# Patient Record
Sex: Female | Born: 1970 | Race: Black or African American | Hispanic: No | Marital: Single | Smoking: Current some day smoker
Health system: Southern US, Community
[De-identification: ages and names within clinical notes are randomized; demographics above are authoritative.]

## PROBLEM LIST (undated history)

## (undated) DIAGNOSIS — Z21 Asymptomatic human immunodeficiency virus [HIV] infection status: Secondary | ICD-10-CM

## (undated) DIAGNOSIS — B20 Human immunodeficiency virus [HIV] disease: Secondary | ICD-10-CM

## (undated) DIAGNOSIS — E119 Type 2 diabetes mellitus without complications: Secondary | ICD-10-CM

## (undated) HISTORY — PX: APPENDECTOMY: SHX54

---

## 1997-11-29 ENCOUNTER — Inpatient Hospital Stay (HOSPITAL_COMMUNITY): Admission: AD | Admit: 1997-11-29 | Discharge: 1997-11-29 | Payer: Self-pay | Admitting: *Deleted

## 1997-12-20 ENCOUNTER — Emergency Department (HOSPITAL_COMMUNITY): Admission: EM | Admit: 1997-12-20 | Discharge: 1997-12-20 | Payer: Self-pay | Admitting: Emergency Medicine

## 1997-12-22 ENCOUNTER — Emergency Department (HOSPITAL_COMMUNITY): Admission: EM | Admit: 1997-12-22 | Discharge: 1997-12-22 | Payer: Self-pay | Admitting: Emergency Medicine

## 1998-01-02 ENCOUNTER — Emergency Department (HOSPITAL_COMMUNITY): Admission: EM | Admit: 1998-01-02 | Discharge: 1998-01-02 | Payer: Self-pay | Admitting: Emergency Medicine

## 1998-03-15 ENCOUNTER — Emergency Department (HOSPITAL_COMMUNITY): Admission: EM | Admit: 1998-03-15 | Discharge: 1998-03-15 | Payer: Self-pay | Admitting: Emergency Medicine

## 1998-04-19 ENCOUNTER — Emergency Department (HOSPITAL_COMMUNITY): Admission: EM | Admit: 1998-04-19 | Discharge: 1998-04-19 | Payer: Self-pay

## 1999-07-20 ENCOUNTER — Inpatient Hospital Stay (HOSPITAL_COMMUNITY): Admission: AD | Admit: 1999-07-20 | Discharge: 1999-07-20 | Payer: Self-pay | Admitting: *Deleted

## 1999-08-01 ENCOUNTER — Ambulatory Visit (HOSPITAL_COMMUNITY): Admission: RE | Admit: 1999-08-01 | Discharge: 1999-08-01 | Payer: Self-pay | Admitting: *Deleted

## 1999-08-01 ENCOUNTER — Encounter: Payer: Self-pay | Admitting: *Deleted

## 1999-08-15 ENCOUNTER — Inpatient Hospital Stay (HOSPITAL_COMMUNITY): Admission: AD | Admit: 1999-08-15 | Discharge: 1999-08-15 | Payer: Self-pay | Admitting: *Deleted

## 1999-12-24 ENCOUNTER — Inpatient Hospital Stay (HOSPITAL_COMMUNITY): Admission: AD | Admit: 1999-12-24 | Discharge: 1999-12-27 | Payer: Self-pay | Admitting: *Deleted

## 1999-12-24 ENCOUNTER — Encounter: Payer: Self-pay | Admitting: *Deleted

## 2000-04-03 ENCOUNTER — Emergency Department (HOSPITAL_COMMUNITY): Admission: EM | Admit: 2000-04-03 | Discharge: 2000-04-03 | Payer: Self-pay | Admitting: Emergency Medicine

## 2000-09-19 ENCOUNTER — Emergency Department (HOSPITAL_COMMUNITY): Admission: EM | Admit: 2000-09-19 | Discharge: 2000-09-19 | Payer: Self-pay | Admitting: Emergency Medicine

## 2000-10-08 ENCOUNTER — Emergency Department (HOSPITAL_COMMUNITY): Admission: EM | Admit: 2000-10-08 | Discharge: 2000-10-08 | Payer: Self-pay | Admitting: Emergency Medicine

## 2001-10-08 ENCOUNTER — Emergency Department (HOSPITAL_COMMUNITY): Admission: EM | Admit: 2001-10-08 | Discharge: 2001-10-08 | Payer: Self-pay | Admitting: Emergency Medicine

## 2002-02-02 ENCOUNTER — Encounter: Payer: Self-pay | Admitting: Emergency Medicine

## 2002-02-02 ENCOUNTER — Emergency Department (HOSPITAL_COMMUNITY): Admission: EM | Admit: 2002-02-02 | Discharge: 2002-02-02 | Payer: Self-pay | Admitting: Emergency Medicine

## 2002-12-23 ENCOUNTER — Emergency Department (HOSPITAL_COMMUNITY): Admission: EM | Admit: 2002-12-23 | Discharge: 2002-12-23 | Payer: Self-pay | Admitting: Emergency Medicine

## 2003-02-02 ENCOUNTER — Emergency Department (HOSPITAL_COMMUNITY): Admission: EM | Admit: 2003-02-02 | Discharge: 2003-02-02 | Payer: Self-pay | Admitting: Emergency Medicine

## 2003-04-08 ENCOUNTER — Emergency Department (HOSPITAL_COMMUNITY): Admission: EM | Admit: 2003-04-08 | Discharge: 2003-04-08 | Payer: Self-pay | Admitting: Emergency Medicine

## 2003-08-08 ENCOUNTER — Emergency Department (HOSPITAL_COMMUNITY): Admission: EM | Admit: 2003-08-08 | Discharge: 2003-08-08 | Payer: Self-pay | Admitting: Emergency Medicine

## 2003-08-12 ENCOUNTER — Emergency Department (HOSPITAL_COMMUNITY): Admission: EM | Admit: 2003-08-12 | Discharge: 2003-08-12 | Payer: Self-pay | Admitting: Emergency Medicine

## 2004-02-28 ENCOUNTER — Emergency Department (HOSPITAL_COMMUNITY): Admission: EM | Admit: 2004-02-28 | Discharge: 2004-02-28 | Payer: Self-pay | Admitting: Emergency Medicine

## 2004-05-21 ENCOUNTER — Emergency Department (HOSPITAL_COMMUNITY): Admission: EM | Admit: 2004-05-21 | Discharge: 2004-05-21 | Payer: Self-pay | Admitting: Emergency Medicine

## 2004-09-10 ENCOUNTER — Inpatient Hospital Stay (HOSPITAL_COMMUNITY): Admission: AD | Admit: 2004-09-10 | Discharge: 2004-09-10 | Payer: Self-pay | Admitting: Obstetrics & Gynecology

## 2004-09-12 ENCOUNTER — Inpatient Hospital Stay (HOSPITAL_COMMUNITY): Admission: RE | Admit: 2004-09-12 | Discharge: 2004-09-12 | Payer: Self-pay | Admitting: *Deleted

## 2005-02-26 ENCOUNTER — Emergency Department (HOSPITAL_COMMUNITY): Admission: EM | Admit: 2005-02-26 | Discharge: 2005-02-26 | Payer: Self-pay | Admitting: Emergency Medicine

## 2005-02-28 ENCOUNTER — Emergency Department (HOSPITAL_COMMUNITY): Admission: EM | Admit: 2005-02-28 | Discharge: 2005-02-28 | Payer: Self-pay | Admitting: Emergency Medicine

## 2005-04-29 ENCOUNTER — Emergency Department (HOSPITAL_COMMUNITY): Admission: EM | Admit: 2005-04-29 | Discharge: 2005-04-29 | Payer: Self-pay | Admitting: *Deleted

## 2005-08-05 ENCOUNTER — Inpatient Hospital Stay (HOSPITAL_COMMUNITY): Admission: AD | Admit: 2005-08-05 | Discharge: 2005-08-05 | Payer: Self-pay | Admitting: Obstetrics and Gynecology

## 2005-09-11 ENCOUNTER — Emergency Department (HOSPITAL_COMMUNITY): Admission: EM | Admit: 2005-09-11 | Discharge: 2005-09-11 | Payer: Self-pay | Admitting: Emergency Medicine

## 2005-10-01 ENCOUNTER — Emergency Department (HOSPITAL_COMMUNITY): Admission: EM | Admit: 2005-10-01 | Discharge: 2005-10-01 | Payer: Self-pay | Admitting: Emergency Medicine

## 2005-10-02 ENCOUNTER — Emergency Department (HOSPITAL_COMMUNITY): Admission: EM | Admit: 2005-10-02 | Discharge: 2005-10-02 | Payer: Self-pay | Admitting: Emergency Medicine

## 2006-04-15 ENCOUNTER — Emergency Department (HOSPITAL_COMMUNITY): Admission: EM | Admit: 2006-04-15 | Discharge: 2006-04-15 | Payer: Self-pay | Admitting: Emergency Medicine

## 2007-06-03 ENCOUNTER — Emergency Department (HOSPITAL_COMMUNITY): Admission: EM | Admit: 2007-06-03 | Discharge: 2007-06-03 | Payer: Self-pay | Admitting: Emergency Medicine

## 2007-08-13 ENCOUNTER — Emergency Department (HOSPITAL_COMMUNITY): Admission: EM | Admit: 2007-08-13 | Discharge: 2007-08-13 | Payer: Self-pay | Admitting: Family Medicine

## 2007-09-14 ENCOUNTER — Emergency Department (HOSPITAL_COMMUNITY): Admission: EM | Admit: 2007-09-14 | Discharge: 2007-09-14 | Payer: Self-pay | Admitting: Emergency Medicine

## 2007-11-08 ENCOUNTER — Inpatient Hospital Stay (HOSPITAL_COMMUNITY): Admission: AD | Admit: 2007-11-08 | Discharge: 2007-11-08 | Payer: Self-pay | Admitting: Family Medicine

## 2007-11-18 ENCOUNTER — Emergency Department (HOSPITAL_COMMUNITY): Admission: EM | Admit: 2007-11-18 | Discharge: 2007-11-18 | Payer: Self-pay | Admitting: Emergency Medicine

## 2007-11-21 ENCOUNTER — Emergency Department (HOSPITAL_COMMUNITY): Admission: EM | Admit: 2007-11-21 | Discharge: 2007-11-21 | Payer: Self-pay | Admitting: Emergency Medicine

## 2008-08-02 ENCOUNTER — Inpatient Hospital Stay (HOSPITAL_COMMUNITY): Admission: AD | Admit: 2008-08-02 | Discharge: 2008-08-02 | Payer: Self-pay | Admitting: Family Medicine

## 2008-08-15 ENCOUNTER — Emergency Department (HOSPITAL_COMMUNITY): Admission: EM | Admit: 2008-08-15 | Discharge: 2008-08-15 | Payer: Self-pay | Admitting: Licensed Clinical Social Worker

## 2008-10-11 ENCOUNTER — Inpatient Hospital Stay (HOSPITAL_COMMUNITY): Admission: AD | Admit: 2008-10-11 | Discharge: 2008-10-11 | Payer: Self-pay | Admitting: Obstetrics

## 2008-10-24 ENCOUNTER — Inpatient Hospital Stay (HOSPITAL_COMMUNITY): Admission: AD | Admit: 2008-10-24 | Discharge: 2008-10-24 | Payer: Self-pay | Admitting: Obstetrics

## 2008-10-28 ENCOUNTER — Ambulatory Visit (HOSPITAL_COMMUNITY): Admission: RE | Admit: 2008-10-28 | Discharge: 2008-10-28 | Payer: Self-pay | Admitting: Obstetrics

## 2008-11-02 ENCOUNTER — Emergency Department (HOSPITAL_COMMUNITY): Admission: EM | Admit: 2008-11-02 | Discharge: 2008-11-02 | Payer: Self-pay | Admitting: Family Medicine

## 2010-05-02 ENCOUNTER — Ambulatory Visit (HOSPITAL_COMMUNITY): Admission: RE | Admit: 2010-05-02 | Discharge: 2010-05-02 | Payer: Self-pay | Admitting: Obstetrics

## 2010-08-29 LAB — CBC
MCH: 25.6 pg — ABNORMAL LOW (ref 26.0–34.0)
MCHC: 32.4 g/dL (ref 30.0–36.0)
MCV: 79 fL (ref 78.0–100.0)
Platelets: 275 10*3/uL (ref 150–400)
RDW: 16.1 % — ABNORMAL HIGH (ref 11.5–15.5)
WBC: 8.3 10*3/uL (ref 4.0–10.5)

## 2010-09-26 LAB — GC/CHLAMYDIA PROBE AMP, GENITAL
Chlamydia, DNA Probe: NEGATIVE
GC Probe Amp, Genital: NEGATIVE

## 2010-09-26 LAB — URINALYSIS, ROUTINE W REFLEX MICROSCOPIC
Bilirubin Urine: NEGATIVE
Specific Gravity, Urine: 1.025 (ref 1.005–1.030)
Urobilinogen, UA: 0.2 mg/dL (ref 0.0–1.0)
pH: 5.5 (ref 5.0–8.0)

## 2010-09-26 LAB — WET PREP, GENITAL

## 2010-09-26 LAB — URINE MICROSCOPIC-ADD ON

## 2010-10-02 LAB — WET PREP, GENITAL
Clue Cells Wet Prep HPF POC: NONE SEEN
Trich, Wet Prep: NONE SEEN

## 2010-10-02 LAB — URINALYSIS, ROUTINE W REFLEX MICROSCOPIC
Bilirubin Urine: NEGATIVE
Ketones, ur: NEGATIVE mg/dL
Protein, ur: NEGATIVE mg/dL
Specific Gravity, Urine: >1.03 — ABNORMAL HIGH (ref 1.005–1.030)
pH: 6 (ref 5.0–8.0)

## 2010-11-02 NOTE — Discharge Summary (Signed)
[facility]  Patient:    Natasha Winters, Natasha Winters                        MRN: [id number] Adm. Date:  [id number] Attending:  Deniece Ree                           Discharge Summary  HISTORY OF PRESENT ILLNESS:   The patient is a 40 year old primigravida, patient of Deniece Ree, M.D. with [facility] of December 24, 1999.  HOSPITAL COURSE:              She was admitted in labor on July 8, and ultrasound done shortly after admission because of question with presentation revealed vertex presentation in the OP position with a hyperextended head.  The patient was allowed to labor and she dilated to 8 cm and 0 station from which she did not make any further progress over the next five hours and she was delivered by a cesarean section, delivering a 7 pound 1 ounce female, Apgars 9 and 9 from the OP position. Postoperatively, the patient did well.  On admission her hemoglobin was 11.9 and postdelivery hemoglobin was 9.3.  She did well and was discharged home on the third postoperative day ambulatory and on a regular diet.  FOLLOW-UP:                    She is to see Deniece Ree, M.D. in six weeks.  DISCHARGE DIAGNOSIS:          Status post primary low transverse cesarean section at term because of cephalopelvic disproportion. DD:  12/27/99 TD:  12/27/99 Job: [id number]

## 2010-11-02 NOTE — H&P (Signed)
[facility]  Patient:    Natasha Winters, Natasha Winters                        MRN: [id number] Adm. Date:  [id number] Attending:  Deniece Ree                         History and Physical  HISTORY OF PRESENT ILLNESS:   Patient is a 40 year old primigravida, [facility] December 24, 1999, patient of Dr. Wiliam Ke, who was admitted in early labor on the night of December 23, 1999.  By 7 a.m. on July 9, she was 4 cm, 100%, with the vertex at a 0 station.  Membranes ruptured on exam and the fluid was clear.  Negative group B strep.  An ultrasound was ordered because of some question about the presentation, and the ultrasound showed vertex presentation in the OP position with a hyperextended head.  Patient was started on Pitocin stimulation because of hypotonic dysfunction and she progressed as far as 8 cm by 10 p.m., after having begun good labor.  There was no change in the cervix over four hours and it was decided she should be delivered by C section because of CPD.  PAST HISTORY:                 The patient had appendectomy at the age of [age].  PHYSICAL EXAMINATION:  GENERAL:                      Revealed a well-developed female in labor.  HEENT:                        Negative.  LUNGS:                        Clear.  HEART:                        Regular rhythm, no murmurs or gallops.  ABDOMEN:                      Term-size uterus.  There is a scar on the right mid quadrant, approximately eight inches long, from previous appendectomy.  PELVIC:                       As described above.  EXTREMITIES:                  Negative. DD:  12/24/99 TD:  12/25/99 Job: [id number]

## 2010-11-02 NOTE — Op Note (Signed)
[facility]  Patient:    Natasha Winters, Natasha Winters                        MRN: [id number] Proc. Date: 12/24/99 Adm. Date:  [id number] Attending:  Deniece Ree                           Operative Report  PREOPERATIVE DIAGNOSIS:       CPD, failure to progress.  POSTOPERATIVE DIAGNOSIS:  OPERATION:  SURGEON:                      Kathreen Cosier, M.D.  ASSISTANT:  ANESTHESIA:                   Epidural.  ESTIMATED BLOOD LOSS:  DESCRIPTION OF PROCEDURE:     The patient was placed on the operating table in he supine position.  The abdomen was prepped and draped.  The bladder was emptied ith a Foley catheter.  A transverse suprapubic incision was made and carried down to the rectus fascia.  The fascia was cleaned and incised the length of the incision. The rectus muscles were retracted laterally and the peritoneum incised longitudinally.  There was a lot of peritoneal reaction and adhesions from her previous appendectomy at age 34.  The bladder using the Metzenbaum scissors a transverse incision was made in the visceroperitoneum above the bladder and the  bladder mobilized inferiorly.  A transverse lower uterine incision was made and the patient delivered from the OP position of a female, Apgars 9 and 9 weighing 7 pounds 1 ounce.  The team was present.  The placenta was anterior and removed manually.  The uterine cavity was cleaned with dry laps.  The uterine incision as closed in one layer with continuous suture of #1 chromic including myometrium and endometrium.  Hemostasis was satisfactory.  The bladder flap was not reattached. The uterus was well contracted.  The tubes and ovaries were normal.  The abdomen was closed in layers.  The peritoneum with continuous suture of 0 chromic, the fascia with continuous suture of 0 Dexon, and the skin was closed with subcuticular stitch of 3-0 Monocryl.  Estimated blood loss was 600 cc.  The  patient tolerated the procedure well and was taken to the recovery room in good condition. DD:  12/24/99 TD:  12/25/99 Job: [id number]

## 2011-03-13 LAB — URINALYSIS, ROUTINE W REFLEX MICROSCOPIC
Protein, ur: NEGATIVE
Urobilinogen, UA: 0.2
pH: 6

## 2011-03-13 LAB — POCT PREGNANCY, URINE: Operator id: 275241

## 2011-03-13 LAB — WET PREP, GENITAL: Trich, Wet Prep: NONE SEEN

## 2011-03-14 LAB — STREP A DNA PROBE: Group A Strep Probe: NEGATIVE

## 2012-02-13 ENCOUNTER — Other Ambulatory Visit (HOSPITAL_COMMUNITY): Payer: Self-pay | Admitting: Obstetrics

## 2012-02-13 DIAGNOSIS — Z1231 Encounter for screening mammogram for malignant neoplasm of breast: Secondary | ICD-10-CM

## 2012-02-21 ENCOUNTER — Ambulatory Visit (HOSPITAL_COMMUNITY): Payer: Medicaid Other

## 2012-02-27 ENCOUNTER — Ambulatory Visit (HOSPITAL_COMMUNITY): Payer: Medicaid Other

## 2012-03-09 ENCOUNTER — Ambulatory Visit (HOSPITAL_COMMUNITY): Payer: Medicaid Other

## 2012-03-16 ENCOUNTER — Ambulatory Visit (HOSPITAL_COMMUNITY)
Admission: RE | Admit: 2012-03-16 | Discharge: 2012-03-16 | Disposition: A | Discharge status: Home / Self Care | Payer: Medicaid Other | Source: Ambulatory Visit | Attending: Obstetrics | Admitting: Obstetrics

## 2012-03-16 DIAGNOSIS — Z1231 Encounter for screening mammogram for malignant neoplasm of breast: Secondary | ICD-10-CM | POA: Insufficient documentation

## 2012-12-06 ENCOUNTER — Encounter (HOSPITAL_COMMUNITY): Payer: Self-pay | Admitting: *Deleted

## 2012-12-06 ENCOUNTER — Emergency Department (HOSPITAL_COMMUNITY)
Admission: EM | Admit: 2012-12-06 | Discharge: 2012-12-06 | Disposition: A | Discharge status: Home / Self Care | Payer: Self-pay | Attending: Emergency Medicine | Admitting: Emergency Medicine

## 2012-12-06 DIAGNOSIS — F17200 Nicotine dependence, unspecified, uncomplicated: Secondary | ICD-10-CM | POA: Insufficient documentation

## 2012-12-06 DIAGNOSIS — Z3202 Encounter for pregnancy test, result negative: Secondary | ICD-10-CM | POA: Insufficient documentation

## 2012-12-06 DIAGNOSIS — N949 Unspecified condition associated with female genital organs and menstrual cycle: Secondary | ICD-10-CM | POA: Insufficient documentation

## 2012-12-06 DIAGNOSIS — N390 Urinary tract infection, site not specified: Secondary | ICD-10-CM | POA: Insufficient documentation

## 2012-12-06 LAB — POCT PREGNANCY, URINE: Preg Test, Ur: NEGATIVE

## 2012-12-06 LAB — URINALYSIS, ROUTINE W REFLEX MICROSCOPIC
Bilirubin Urine: NEGATIVE
Glucose, UA: NEGATIVE mg/dL
Ketones, ur: NEGATIVE mg/dL
Specific Gravity, Urine: 1.024 (ref 1.005–1.030)
pH: 7 (ref 5.0–8.0)

## 2012-12-06 LAB — URINE MICROSCOPIC-ADD ON

## 2012-12-06 MED ORDER — CEFTRIAXONE SODIUM 1 G IJ SOLR
1.0000 g | Freq: Once | INTRAMUSCULAR | Status: AC
  Administered 2012-12-06: 1 g via INTRAMUSCULAR
  Filled 2012-12-06: qty 10

## 2012-12-06 MED ORDER — AZITHROMYCIN 1 G PO PACK
1.0000 g | PACK | Freq: Once | ORAL | Status: AC
  Administered 2012-12-06: 1 g via ORAL
  Filled 2012-12-06: qty 1

## 2012-12-06 MED ORDER — ONDANSETRON 8 MG PO TBDP
8.0000 mg | ORAL_TABLET | Freq: Once | ORAL | Status: AC
  Administered 2012-12-06: 8 mg via ORAL
  Filled 2012-12-06: qty 1

## 2012-12-06 MED ORDER — METRONIDAZOLE 500 MG PO TABS: 500.0000 mg | ORAL_TABLET | Freq: Two times a day (BID) | ORAL | Status: DC

## 2012-12-06 NOTE — ED Provider Notes (Signed)
Medical screening examination/treatment/procedure(s) were performed by non-physician practitioner and as supervising physician I was immediately available for consultation/collaboration.    Nelia Shi, MD 12/06/12 [phone]

## 2012-12-06 NOTE — ED Provider Notes (Signed)
History     CSN: [id number]  Arrival date & time 12/06/12  3244   First MD Initiated Contact with Patient 12/06/12 [phone]      Chief Complaint  Patient presents with  . Abdominal Pain  . Vaginal Pain    (Consider location/radiation/quality/duration/timing/severity/associated sxs/prior treatment) HPI  42 year old female presents the emergency department with chief complaint of lower quadrant abdominal pain and vaginal discomfort.  Patient states that she began having crampy intermittent moderate left lower quadrant abdominal pain yesterday.  she did not take anything to relieve that pain. She denies constipation, diarrhea, nausea, vomiting.  Patient also complains that her vagina "feels funny."  Patient is unable to be more descriptive than that.  Last sexual encounter was greater than 3 months ago.  She denies any foul odor, discharge, vaginal pain or itching.  Last menstrual period was on 6/15 and ended yesterday.  History reviewed. No pertinent past medical history.  Past Surgical History  Procedure Laterality Date  . Appendectomy      No family history on file.  History  Substance Use Topics  . Smoking status: Current Some Day Smoker  . Smokeless tobacco: Not on file  . Alcohol Use: Yes     Comment: occasionally    OB History   Grav Para Term Preterm Abortions TAB SAB Ect Mult Living                  Review of Systems Ten systems reviewed and are negative for acute change, except as noted in the HPI.   Allergies  Review of patient's allergies indicates no known allergies.  Home Medications   Current Outpatient Rx  Name  Route  Sig  Dispense  Refill  . naproxen sodium (ANAPROX) 220 MG tablet   Oral   Take 220 mg by mouth 2 (two) times daily with a meal.           BP 138/84  Pulse 80  Temp(Src) 97.9 F (36.6 C) (Oral)  Resp 16  SpO2 98%  LMP 11/29/2012  Physical Exam Physical Exam  Nursing note and vitals reviewed. Constitutional: She is  oriented to person, place, and time. She appears well-developed and well-nourished. No distress.  HENT:  Head: Normocephalic and atraumatic.  Eyes: Conjunctivae normal and EOM are normal. Pupils are equal, round, and reactive to light. No scleral icterus.  Neck: Normal range of motion.  Cardiovascular: Normal rate, regular rhythm and normal heart sounds.  Exam reveals no gallop and no friction rub.   No murmur heard. Pulmonary/Chest: Effort normal and breath sounds normal. No respiratory distress.  Abdominal: Soft. Bowel sounds are normal. She exhibits no distension and no mass. There is no tenderness. There is no guarding.  Neurological: She is alert and oriented to person, place, and time.  Skin: Skin is warm and dry. She is not diaphoretic.  Pelvic exam: normal external genitalia, vulva, vagina, cervix, uterus and adnexa.   ED Course  Procedures (including critical care time)  Labs Reviewed  URINALYSIS, ROUTINE W REFLEX MICROSCOPIC - Abnormal; Notable for the following:    APPearance CLOUDY (*)    Hgb urine dipstick MODERATE (*)    Leukocytes, UA SMALL (*)    All other components within normal limits  GC/CHLAMYDIA PROBE AMP  WET PREP, GENITAL  URINE MICROSCOPIC-ADD ON  POCT PREGNANCY, URINE   No results found.   1. UTI (lower urinary tract infection)   2. Vaginal discomfort       MDM  10:47  AM BP 138/84  Pulse 80  Temp(Src) 97.9 F (36.6 C) (Oral)  Resp 16  SpO2 98%  LMP 11/29/2012 Patient here with complaint of left lower quadrant abdominal pain.  UA shows Trichomonas. Wet prep is pending.    .11:05 AM BP 138/84  Pulse 80  Temp(Src) 97.9 F (36.6 C) (Oral)  Resp 16  SpO2 98%  LMP 11/29/2012 Lab called and wet prep/ endocervical probes were placed in the wrong tubes. Patient pelvic exam was difficult due to her body habitus. Rather than repeat I will treat the patient empirically.  She will be given ROcephin and azithromycin. D/c with Flagyll.  I  disussed with Dr. Radford Pax who agrees with my plan of care.    Arthor Captain, PA-C 12/06/12 1249

## 2012-12-06 NOTE — ED Notes (Signed)
poc pregnancy negative 

## 2012-12-06 NOTE — ED Notes (Signed)
Pt reports onset of lower abd pain and vaginal pain since yesterday. Hx of UTIs. Denies hematuria, nausea or vomiting.

## 2012-12-15 ENCOUNTER — Emergency Department (HOSPITAL_COMMUNITY)
Admission: EM | Admit: 2012-12-15 | Discharge: 2012-12-15 | Disposition: A | Discharge status: Home / Self Care | Payer: Self-pay | Attending: Emergency Medicine | Admitting: Emergency Medicine

## 2012-12-15 ENCOUNTER — Encounter (HOSPITAL_COMMUNITY): Payer: Self-pay | Admitting: Emergency Medicine

## 2012-12-15 DIAGNOSIS — R300 Dysuria: Secondary | ICD-10-CM | POA: Insufficient documentation

## 2012-12-15 DIAGNOSIS — Z792 Long term (current) use of antibiotics: Secondary | ICD-10-CM | POA: Insufficient documentation

## 2012-12-15 DIAGNOSIS — F17200 Nicotine dependence, unspecified, uncomplicated: Secondary | ICD-10-CM | POA: Insufficient documentation

## 2012-12-15 DIAGNOSIS — R350 Frequency of micturition: Secondary | ICD-10-CM | POA: Insufficient documentation

## 2012-12-15 DIAGNOSIS — Z3202 Encounter for pregnancy test, result negative: Secondary | ICD-10-CM | POA: Insufficient documentation

## 2012-12-15 LAB — URINALYSIS, ROUTINE W REFLEX MICROSCOPIC
Glucose, UA: NEGATIVE mg/dL
Ketones, ur: NEGATIVE mg/dL
Leukocytes, UA: NEGATIVE
Protein, ur: NEGATIVE mg/dL
Urobilinogen, UA: 0.2 mg/dL (ref 0.0–1.0)

## 2012-12-15 LAB — POCT PREGNANCY, URINE: Preg Test, Ur: NEGATIVE

## 2012-12-15 LAB — WET PREP, GENITAL: Trich, Wet Prep: NONE SEEN

## 2012-12-15 NOTE — ED Provider Notes (Signed)
Medical screening examination/treatment/procedure(s) were performed by non-physician practitioner and as supervising physician I was immediately available for consultation/collaboration.   Celene Kras, MD 12/15/12 [phone]

## 2012-12-15 NOTE — ED Provider Notes (Signed)
History    CSN: [id number] Arrival date & time 12/15/12  0847  First MD Initiated Contact with Patient 12/15/12 [phone]     No chief complaint on file.  (Consider location/radiation/quality/duration/timing/severity/associated sxs/prior Treatment) HPI Comments: 42 y.o. Female with PMHx of trichomoniasis presents today complaining of a burning pain with urination that started about two weeks ago, went away after a visit to the ED and treatment on 12/06/12, and has returned. This is a recurrent problem for the patient. Pt states that for the past two days, she has been experiencing a moderate to severe burning sensation when she urinates as well as frequent urination. Pt denies fever, flank plain, abdominal or pelvic pain, hematuria, vaginal discharge or odor, nausea, vomiting.   Review of records show pt was here on 12/06/12 for tx of same and was treated with Flagyl for trich. Pt states she did take all of the medicine. Pt states that she has refrained from sexual intercourse since that time.  No UTI was noted. Review of culture shows no bacterial growth from that sample.   Note pt was also treated with Rocephin and Zithromax at the 12/06/12 visit.   Patient is a 42 y.o. female presenting with dysuria. The history is provided by the patient.  Dysuria Pain quality:  Burning Pain severity:  Moderate Onset quality:  Gradual Duration:  2 days Timing:  Constant Progression:  Worsening Chronicity:  Recurrent Recent UTI: no bacteria grew in the culture on 12/06/12 but pt presented with sx.   Relieved by:  Nothing Worsened by:  Nothing tried Ineffective treatments:  None tried Urinary symptoms: frequent urination   Urinary symptoms: no discolored urine, no foul-smelling urine, no hematuria and no bladder incontinence   Associated symptoms: no fever, no flank pain, no nausea, no vaginal discharge and no vomiting   Risk factors: recurrent urinary tract infections    No past medical history on  file. Past Surgical History  Procedure Laterality Date  . Appendectomy     No family history on file. History  Substance Use Topics  . Smoking status: Current Some Day Smoker  . Smokeless tobacco: Not on file  . Alcohol Use: Yes     Comment: occasionally   OB History   Grav Para Term Preterm Abortions TAB SAB Ect Mult Living                 Review of Systems  Constitutional: Negative for fever, chills and diaphoresis.  HENT: Negative for neck pain and neck stiffness.   Eyes: Negative for visual disturbance.  Respiratory: Negative for apnea, chest tightness and shortness of breath.   Cardiovascular: Negative for chest pain and palpitations.  Gastrointestinal: Negative for nausea, vomiting, diarrhea and constipation.  Genitourinary: Positive for dysuria and frequency. Negative for urgency, hematuria, flank pain, decreased urine volume, vaginal bleeding, vaginal discharge, difficulty urinating and pelvic pain.  Musculoskeletal: Negative for back pain.  Skin: Negative for rash.  Neurological: Negative for dizziness, weakness, light-headedness, numbness and headaches.    Allergies  Review of patient's allergies indicates no known allergies.  Home Medications   Current Outpatient Rx  Name  Route  Sig  Dispense  Refill  . metroNIDAZOLE (FLAGYL) 500 MG tablet   Oral   Take 1 tablet (500 mg total) by mouth 2 (two) times daily. One po bid x 7 days   14 tablet   0   . naproxen sodium (ANAPROX) 220 MG tablet   Oral   Take 220 mg by  mouth 2 (two) times daily with a meal.          BP 134/87  Pulse 83  Temp(Src) 98.6 F (37 C) (Oral)  Resp 20  SpO2 98%  LMP 11/29/2012 Physical Exam  Nursing note and vitals reviewed. Constitutional: She is oriented to person, place, and time. She appears well-developed and well-nourished. No distress.  HENT:  Head: Normocephalic and atraumatic.  Eyes: Conjunctivae and EOM are normal.  Neck: Normal range of motion. Neck supple.   Cardiovascular: Normal rate, regular rhythm, normal heart sounds and intact distal pulses.   Pulmonary/Chest: Effort normal and breath sounds normal. No respiratory distress.  Abdominal: Soft. Bowel sounds are normal. There is no tenderness.  Genitourinary:  No external lesions. Small amount of mucopurulent discharge in vaginal canal. No CMT. No adnexal tenderness. Chaperone present.   Musculoskeletal: Normal range of motion. She exhibits no edema and no tenderness.  Neurological: She is alert and oriented to person, place, and time. No cranial nerve deficit.  Skin: Skin is warm and dry. No rash noted. She is not diaphoretic.  Psychiatric: She has a normal mood and affect.    ED Course  Procedures (including critical care time) Labs Reviewed  WET PREP, GENITAL - Abnormal; Notable for the following:    WBC, Wet Prep HPF POC RARE (*)    All other components within normal limits  URINALYSIS, ROUTINE W REFLEX MICROSCOPIC - Abnormal; Notable for the following:    APPearance CLOUDY (*)    All other components within normal limits  GC/CHLAMYDIA PROBE AMP  POCT PREGNANCY, URINE   No results found. 1. Burning with urination     MDM  Pt today is well-appearing, non toxic, afebrile. Pt with frequent urinary issues. Dx with trich last week and did finish her course of Flagyl. Will do pelvic, wet prep, UA, and re-evaluate. Have already counseled the importance of following up with the [facility] and establishing a relationship with a primary care physician.   No abdominal or pelvic pain with palpation. No CVA tenderness. Small amount of mucopurulent discharge in vaginal vault. No CMT. No adnexal tenderness. Not concerning for PID.  UA and wet prep have returned within normal limits. Discussed with pt that there is nothing acute or emergent required today and that she should, as discussed, follow up with [facility] as well as establish a relationship with a primary care doctor. Contact  information and resource list provided.     Glade Nurse, PA-C 12/15/12 [phone]

## 2012-12-15 NOTE — ED Notes (Signed)
Per pt, has had UTI symptoms on and off for over a week-burning with urination-feels like she cant empty bladder

## 2012-12-15 NOTE — Progress Notes (Signed)
[facility] has seen patient and provided her with a oc application. °

## 2012-12-16 LAB — GC/CHLAMYDIA PROBE AMP: CT Probe RNA: NEGATIVE

## 2013-12-20 ENCOUNTER — Other Ambulatory Visit (HOSPITAL_COMMUNITY): Payer: Self-pay | Admitting: Obstetrics

## 2013-12-20 DIAGNOSIS — Z1231 Encounter for screening mammogram for malignant neoplasm of breast: Secondary | ICD-10-CM

## 2013-12-23 ENCOUNTER — Ambulatory Visit (HOSPITAL_COMMUNITY): Payer: Medicaid Other

## 2013-12-24 ENCOUNTER — Ambulatory Visit (HOSPITAL_COMMUNITY): Payer: Medicaid Other

## 2013-12-28 ENCOUNTER — Other Ambulatory Visit (HOSPITAL_COMMUNITY): Payer: Self-pay | Admitting: Obstetrics

## 2013-12-28 ENCOUNTER — Ambulatory Visit (HOSPITAL_COMMUNITY)
Admission: RE | Admit: 2013-12-28 | Discharge: 2013-12-28 | Disposition: A | Discharge status: Home / Self Care | Payer: Medicaid Other | Source: Ambulatory Visit | Attending: Obstetrics | Admitting: Obstetrics

## 2013-12-28 DIAGNOSIS — Z803 Family history of malignant neoplasm of breast: Secondary | ICD-10-CM

## 2013-12-28 DIAGNOSIS — Z1231 Encounter for screening mammogram for malignant neoplasm of breast: Secondary | ICD-10-CM

## 2015-03-07 ENCOUNTER — Encounter (HOSPITAL_BASED_OUTPATIENT_CLINIC_OR_DEPARTMENT_OTHER): Payer: Self-pay | Admitting: Emergency Medicine

## 2015-03-07 ENCOUNTER — Emergency Department (HOSPITAL_BASED_OUTPATIENT_CLINIC_OR_DEPARTMENT_OTHER)
Admission: EM | Admit: 2015-03-07 | Discharge: 2015-03-07 | Disposition: A | Discharge status: Home / Self Care | Payer: Medicaid Other | Attending: Emergency Medicine | Admitting: Emergency Medicine

## 2015-03-07 DIAGNOSIS — N3000 Acute cystitis without hematuria: Secondary | ICD-10-CM | POA: Diagnosis not present

## 2015-03-07 DIAGNOSIS — R3919 Other difficulties with micturition: Secondary | ICD-10-CM | POA: Diagnosis present

## 2015-03-07 DIAGNOSIS — Z3202 Encounter for pregnancy test, result negative: Secondary | ICD-10-CM | POA: Insufficient documentation

## 2015-03-07 DIAGNOSIS — Z720 Tobacco use: Secondary | ICD-10-CM | POA: Diagnosis not present

## 2015-03-07 DIAGNOSIS — Z791 Long term (current) use of non-steroidal anti-inflammatories (NSAID): Secondary | ICD-10-CM | POA: Diagnosis not present

## 2015-03-07 LAB — URINALYSIS, ROUTINE W REFLEX MICROSCOPIC
BILIRUBIN URINE: NEGATIVE
Glucose, UA: NEGATIVE mg/dL
Hgb urine dipstick: NEGATIVE
KETONES UR: NEGATIVE mg/dL
NITRITE: NEGATIVE
PROTEIN: NEGATIVE mg/dL
Specific Gravity, Urine: 1.024 (ref 1.005–1.030)
UROBILINOGEN UA: 1 mg/dL (ref 0.0–1.0)
pH: 6 (ref 5.0–8.0)

## 2015-03-07 LAB — PREGNANCY, URINE: PREG TEST UR: NEGATIVE

## 2015-03-07 LAB — URINE MICROSCOPIC-ADD ON

## 2015-03-07 MED ORDER — SULFAMETHOXAZOLE-TRIMETHOPRIM 800-160 MG PO TABS
1.0000 | ORAL_TABLET | Freq: Once | ORAL | Status: AC
  Administered 2015-03-07: 1 via ORAL
  Filled 2015-03-07: qty 1

## 2015-03-07 MED ORDER — PHENAZOPYRIDINE HCL 200 MG PO TABS: 200.0000 mg | ORAL_TABLET | Freq: Three times a day (TID) | ORAL | Status: DC | PRN

## 2015-03-07 MED ORDER — PHENAZOPYRIDINE HCL 100 MG PO TABS
200.0000 mg | ORAL_TABLET | Freq: Once | ORAL | Status: AC
  Administered 2015-03-07: 200 mg via ORAL
  Filled 2015-03-07: qty 2

## 2015-03-07 MED ORDER — SULFAMETHOXAZOLE-TRIMETHOPRIM 800-160 MG PO TABS: 1.0000 | ORAL_TABLET | Freq: Two times a day (BID) | ORAL | Status: AC

## 2015-03-07 NOTE — Discharge Instructions (Signed)
Urinary Tract Infection Call any of the numbers on the resource guide to get a primary care physician and to be seen if not feeling better by next week. Return if your condition worsens for any reason. A urinary tract infection (UTI) can occur any place along the urinary tract. The tract includes the kidneys, ureters, bladder, and urethra. A type of germ called bacteria often causes a UTI. UTIs are often helped with antibiotic medicine.  HOME CARE   If given, take antibiotics as told by your doctor. Finish them even if you start to feel better.  Drink enough fluids to keep your pee (urine) clear or pale yellow.  Avoid tea, drinks with caffeine, and bubbly (carbonated) drinks.  Pee often. Avoid holding your pee in for a long time.  Pee before and after having sex (intercourse).  Wipe from front to back after you poop (bowel movement) if you are a woman. Use each tissue only once. GET HELP RIGHT AWAY IF:   You have back pain.  You have lower belly (abdominal) pain.  You have chills.  You feel sick to your stomach (nauseous).  You throw up (vomit).  Your burning or discomfort with peeing does not go away.  You have a fever.  Your symptoms are not better in 3 days. MAKE SURE YOU:   Understand these instructions.  Will watch your condition.  Will get help right away if you are not doing well or get worse. Document Released: 11/20/2007 Document Revised: 02/26/2012 Document Reviewed: 01/02/2012 [facility] Patient Information 2015 [place], [place]. This information is not intended to replace advice given to you by your health care provider. Make sure you discuss any questions you have with your health care provider.

## 2015-03-07 NOTE — ED Provider Notes (Signed)
CSN: [id number]     Arrival date & time 03/07/15  2030 History   None    Chief Complaint  Patient presents with  . Burning with Urination      (Consider location/radiation/quality/duration/timing/severity/associated sxs/prior Treatment) HPI Complains of burning with urination at urethral meatus onset yesterday. No other associated symptoms. No fever. No nausea or vomiting. No vaginal discharge. No treatment prior to coming here. Symptoms worse with urination. Presently asymptomatic as I examine her. History reviewed. No pertinent past medical history. Past Surgical History  Procedure Laterality Date  . Appendectomy     History reviewed. No pertinent family history. Social History  Substance Use Topics  . Smoking status: Current Some Day Smoker  . Smokeless tobacco: None  . Alcohol Use: Yes     Comment: occasionally   OB History    No data available     Review of Systems  Constitutional: Negative.   HENT: Negative.   Respiratory: Negative.   Cardiovascular: Negative.   Gastrointestinal: Negative.   Genitourinary: Positive for dysuria.  Musculoskeletal: Negative.   Skin: Negative.   Neurological: Negative.   Psychiatric/Behavioral: Negative.   All other systems reviewed and are negative.     Allergies  Review of patient's allergies indicates no known allergies.  Home Medications   Prior to Admission medications   Medication Sig Start Date End Date Taking? Authorizing Provider  metroNIDAZOLE (FLAGYL) 500 MG tablet Take 1 tablet (500 mg total) by mouth 2 (two) times daily. One po bid x 7 days 12/06/12   Arthor Captain, PA-C  naproxen sodium (ANAPROX) 220 MG tablet Take 220 mg by mouth 2 (two) times daily with a meal.    Historical Provider, MD   BP 137/92 mmHg  Pulse 85  Temp(Src) 98.5 F (36.9 C) (Oral)  Resp 18  Ht  (1.702 m)  Wt 270 lb (122.471 kg)  BMI 42.28 kg/m2  SpO2 96% Physical Exam  Constitutional: She appears well-developed and  well-nourished.  HENT:  Head: Normocephalic and atraumatic.  Eyes: Conjunctivae are normal. Pupils are equal, round, and reactive to light.  Neck: Neck supple. No tracheal deviation present. No thyromegaly present.  Cardiovascular: Normal rate and regular rhythm.   No murmur heard. Pulmonary/Chest: Effort normal and breath sounds normal.  Abdominal: Soft. Bowel sounds are normal. She exhibits no distension. There is no tenderness.  Obese  Musculoskeletal: Normal range of motion. She exhibits no edema or tenderness.  Neurological: She is alert. Coordination normal.  Skin: Skin is warm and dry. No rash noted.  Psychiatric: She has a normal mood and affect.  Nursing note and vitals reviewed.   ED Course  Procedures (including critical care time) Labs Review Labs Reviewed  URINALYSIS, ROUTINE W REFLEX MICROSCOPIC (NOT AT [facility]) - Abnormal; Notable for the following:    APPearance CLOUDY (*)    Leukocytes, UA MODERATE (*)    All other components within normal limits  URINE MICROSCOPIC-ADD ON - Abnormal; Notable for the following:    Squamous Epithelial / LPF MANY (*)    Bacteria, UA FEW (*)    All other components within normal limits  PREGNANCY, URINE    Imaging Review No results found. I have personally reviewed and evaluated these images and lab results as part of my medical decision-making.   EKG Interpretation None     Results for orders placed or performed during the hospital encounter of 03/07/15  Urinalysis, Routine w reflex microscopic (not at [facility])  Result Value Ref Range  Color, Urine YELLOW YELLOW   APPearance CLOUDY (A) CLEAR   Specific Gravity, Urine 1.024 1.005 - 1.030   pH 6.0 5.0 - 8.0   Glucose, UA NEGATIVE NEGATIVE mg/dL   Hgb urine dipstick NEGATIVE NEGATIVE   Bilirubin Urine NEGATIVE NEGATIVE   Ketones, ur NEGATIVE NEGATIVE mg/dL   Protein, ur NEGATIVE NEGATIVE mg/dL   Urobilinogen, UA 1.0 0.0 - 1.0 mg/dL   Nitrite NEGATIVE NEGATIVE   Leukocytes,  UA MODERATE (A) NEGATIVE  Pregnancy, urine  Result Value Ref Range   Preg Test, Ur NEGATIVE NEGATIVE  Urine microscopic-add on  Result Value Ref Range   Squamous Epithelial / LPF MANY (A) RARE   WBC, UA 7-10 <3 WBC/hpf   RBC / HPF 0-2 <3 RBC/hpf   Bacteria, UA FEW (A) RARE   Urine-Other MUCOUS PRESENT    No results found.  MDM  Urine contaminated however patient has symptoms of cystitis. Plan prescription Pyridium, Bactrim DS. Referral resource guide Diagnosis acute cystitis Final diagnoses:  None        Doug Sou, MD 03/07/15 2351

## 2015-03-07 NOTE — ED Notes (Signed)
Pt states she started having burning with urination yesterday and thinks she has a UTI

## 2015-03-09 ENCOUNTER — Emergency Department (HOSPITAL_COMMUNITY)
Admission: EM | Admit: 2015-03-09 | Discharge: 2015-03-09 | Disposition: A | Discharge status: Home / Self Care | Payer: Medicaid Other | Attending: Emergency Medicine | Admitting: Emergency Medicine

## 2015-03-09 ENCOUNTER — Encounter (HOSPITAL_COMMUNITY): Payer: Self-pay | Admitting: Emergency Medicine

## 2015-03-09 DIAGNOSIS — Z720 Tobacco use: Secondary | ICD-10-CM | POA: Insufficient documentation

## 2015-03-09 DIAGNOSIS — N739 Female pelvic inflammatory disease, unspecified: Secondary | ICD-10-CM | POA: Diagnosis not present

## 2015-03-09 DIAGNOSIS — N898 Other specified noninflammatory disorders of vagina: Secondary | ICD-10-CM | POA: Diagnosis present

## 2015-03-09 LAB — WET PREP, GENITAL: Trich, Wet Prep: NONE SEEN

## 2015-03-09 LAB — URINALYSIS, ROUTINE W REFLEX MICROSCOPIC
BILIRUBIN URINE: NEGATIVE
GLUCOSE, UA: NEGATIVE mg/dL
HGB URINE DIPSTICK: NEGATIVE
KETONES UR: NEGATIVE mg/dL
Nitrite: NEGATIVE
PH: 8 (ref 5.0–8.0)
Protein, ur: NEGATIVE mg/dL
Specific Gravity, Urine: 1.022 (ref 1.005–1.030)
Urobilinogen, UA: 1 mg/dL (ref 0.0–1.0)

## 2015-03-09 LAB — URINE MICROSCOPIC-ADD ON

## 2015-03-09 MED ORDER — FLUCONAZOLE 150 MG PO TABS
150.0000 mg | ORAL_TABLET | Freq: Once | ORAL | Status: AC
  Administered 2015-03-09: 150 mg via ORAL
  Filled 2015-03-09: qty 1

## 2015-03-09 MED ORDER — LIDOCAINE HCL (PF) 1 % IJ SOLN
2.0000 mL | Freq: Once | INTRAMUSCULAR | Status: AC
  Administered 2015-03-09: 0.9 mL via INTRADERMAL
  Filled 2015-03-09: qty 5

## 2015-03-09 MED ORDER — CEFTRIAXONE SODIUM 250 MG IJ SOLR
250.0000 mg | Freq: Once | INTRAMUSCULAR | Status: AC
  Administered 2015-03-09: 250 mg via INTRAMUSCULAR
  Filled 2015-03-09: qty 250

## 2015-03-09 MED ORDER — DOXYCYCLINE HYCLATE 100 MG PO CAPS: 100.0000 mg | ORAL_CAPSULE | Freq: Two times a day (BID) | ORAL | Status: DC

## 2015-03-09 NOTE — ED Notes (Signed)
Awake. Verbally responsive. A/O x4. Resp even and unlabored. No audible adventitious breath sounds noted. ABC's intact. Pt ambulated to BR with steady gait. 

## 2015-03-09 NOTE — ED Notes (Signed)
Per patient states she is having vaginal discomfort-was diagnosed with UTI on the 20 th-states she finished meds

## 2015-03-09 NOTE — ED Notes (Signed)
Awake. Verbally responsive. A/O x4. Resp even and unlabored. No audible adventitious breath sounds noted. ABC's intact.  

## 2015-03-09 NOTE — ED Notes (Signed)
Dr. Mitchel Honour at bedside to do pelvic with ED tech assist.

## 2015-03-09 NOTE — ED Notes (Signed)
Awake. Verbally responsive. A/O x4. Resp even and unlabored. No audible adventitious breath sounds noted. ABC's intact. Pt watching TV. 

## 2015-03-09 NOTE — ED Provider Notes (Signed)
CSN: [id number]     Arrival date & time 03/09/15  1039 History   First MD Initiated Contact with Patient 03/09/15 1122     Chief Complaint  Patient presents with  . vaginal irritation      (Consider location/radiation/quality/duration/timing/severity/associated sxs/prior Treatment) HPI Comments: Pt comes in with vaginal irritation. Pt reports that she was discharged with uti meds. She is taking the meds, but she continues to have itching, burning. Burning is with urination. Pt has no vaginal discharge, but the urine has foul odor. Pt's LMP was 2 months ago. No caginal bleeding. No back pain, fevers, nausea.  Pt is having unprotected intercourse with a partner who lives out of town. No hx of STD.  The history is provided by the patient.    History reviewed. No pertinent past medical history. Past Surgical History  Procedure Laterality Date  . Appendectomy     No family history on file. Social History  Substance Use Topics  . Smoking status: Current Some Day Smoker  . Smokeless tobacco: None  . Alcohol Use: Yes     Comment: occasionally   OB History    No data available     Review of Systems  Constitutional: Positive for activity change.  Respiratory: Negative for shortness of breath.   Cardiovascular: Negative for chest pain.  Gastrointestinal: Negative for nausea, vomiting and abdominal pain.  Genitourinary: Positive for dysuria and frequency. Negative for hematuria, flank pain, vaginal bleeding and vaginal discharge.  Musculoskeletal: Negative for arthralgias and neck pain.  Neurological: Negative for headaches.      Allergies  Review of patient's allergies indicates no known allergies.  Home Medications   Prior to Admission medications   Medication Sig Start Date End Date Taking? Authorizing Provider  naproxen sodium (ANAPROX) 220 MG tablet Take 220 mg by mouth 2 (two) times daily as needed (pain).    Yes Historical Provider, MD  doxycycline (VIBRAMYCIN) 100 MG  capsule Take 1 capsule (100 mg total) by mouth 2 (two) times daily. 03/09/15   Derwood Kaplan, MD  phenazopyridine (PYRIDIUM) 200 MG tablet Take 1 tablet (200 mg total) by mouth 3 (three) times daily as needed for pain. Patient not taking: Reported on 03/09/2015 03/07/15   Doug Sou, MD  sulfamethoxazole-trimethoprim (BACTRIM DS,SEPTRA DS) 800-160 MG per tablet Take 1 tablet by mouth 2 (two) times daily. Patient not taking: Reported on 03/09/2015 03/07/15 03/14/15  Doug Sou, MD   BP 129/89 mmHg  Pulse 69  Temp(Src) 98.1 F (36.7 C) (Oral)  Resp 18  SpO2 98% Physical Exam  Constitutional: She is oriented to person, place, and time. She appears well-developed.  HENT:  Head: Normocephalic and atraumatic.  Eyes: Conjunctivae and EOM are normal. Pupils are equal, round, and reactive to light.  Neck: Normal range of motion. Neck supple.  Cardiovascular: Normal rate, regular rhythm, normal heart sounds and intact distal pulses.   No murmur heard. Pulmonary/Chest: Effort normal. No respiratory distress. She has no wheezes.  Abdominal: Soft. Bowel sounds are normal. She exhibits no distension. There is no tenderness. There is no rebound and no guarding.  Genitourinary: Vagina normal and uterus normal.  External exam - normal, no lesions Speculum exam: Pt has some white discharge, no blood Bimanual exam: Patient has no CMT, no adnexal tenderness or fullness and cervical os is closed  Neurological: She is alert and oriented to person, place, and time.  Skin: Skin is warm and dry.    ED Course  Procedures (including critical care  time) Labs Review Labs Reviewed  WET PREP, GENITAL - Abnormal; Notable for the following:    Yeast Wet Prep HPF POC FEW (*)    Clue Cells Wet Prep HPF POC FEW (*)    WBC, Wet Prep HPF POC FEW (*)    All other components within normal limits  URINALYSIS, ROUTINE W REFLEX MICROSCOPIC (NOT AT [facility]) - Abnormal; Notable for the following:    Leukocytes, UA SMALL  (*)    All other components within normal limits  URINE CULTURE  URINE MICROSCOPIC-ADD ON  GC/CHLAMYDIA PROBE AMP ([facility]) NOT AT [facility]    Imaging Review No results found. I have personally reviewed and evaluated these images and lab results as part of my medical decision-making.   EKG Interpretation None      MDM   Final diagnoses:  Pelvic infection in female    Pt with dysuria. She is s/p bactrim. She has some L sided lower quadrant tenderness, pelvic exam done - + yeast infection seen. She had mild tenderness with cervical manipulation. She agrees to getting im ceftriaxone and doxy oral prophylactically.  No further workup indicated at the moment.    Derwood Kaplan, MD 03/09/15 1419

## 2015-03-09 NOTE — Discharge Instructions (Signed)
The exam results here are + for YEAST INFECTION. WE gave you the single dose needed to treat the infection while you were in the ER.  Take the antibiotics prescribed.   Pelvic Inflammatory Disease Pelvic inflammatory disease (PID) is an infection in some or all of the female organs. PID can be in the uterus, ovaries, fallopian tubes, or the surrounding tissues inside the lower belly area (pelvis). HOME CARE   If given, take your antibiotic medicine as told. Finish them even if you start to feel better.  Only take medicine as told by your doctor.  Do not have sex (intercourse) until treatment is done or as told by your doctor.  Tell your sex partner if you have PID. Your partner may need to be treated.  Keep all doctor visits. GET HELP RIGHT AWAY IF:   You have a fever.  You have more belly (abdominal) or lower belly pain.  You have chills.  You have pain when you pee (urinate).  You are not better after 72 hours.  You have more fluid (discharge) coming from your vagina or fluid that is not normal.  You need pain medicine from your doctor.  You throw up (vomit).  You cannot take your medicines.  Your partner has a sexually transmitted disease (STD). MAKE SURE YOU:   Understand these instructions.  Will watch your condition.  Will get help right away if you are not doing well or get worse. Document Released: 08/30/2008 Document Revised: 09/28/2012 Document Reviewed: 05/30/2011 [facility] Patient Information 2015 [place], [place]. This information is not intended to replace advice given to you by your health care provider. Make sure you discuss any questions you have with your health care provider.

## 2015-03-09 NOTE — ED Notes (Addendum)
Pt reported reported vaginal itching/burning and odor prior to taking. Pt denies vaginal discharge. Pt reported that she completed the ABT prescription for UTI. Pt reported that she continues to have the same symptoms. Pt reported pressure/burning with void. Denies hematuria, urinary/frequency, lower abd/back pain, foul odor and dysuria.

## 2015-03-10 LAB — URINE CULTURE: SPECIAL REQUESTS: NORMAL

## 2015-03-10 LAB — GC/CHLAMYDIA PROBE AMP (~~LOC~~) NOT AT ARMC
Chlamydia: NEGATIVE
Neisseria Gonorrhea: NEGATIVE

## 2016-03-02 LAB — LAB REPORT - SCANNED

## 2016-07-23 ENCOUNTER — Emergency Department (HOSPITAL_COMMUNITY)
Admission: EM | Admit: 2016-07-23 | Discharge: 2016-07-23 | Disposition: A | Discharge status: Home / Self Care | Payer: No Typology Code available for payment source | Attending: Emergency Medicine | Admitting: Emergency Medicine

## 2016-07-23 ENCOUNTER — Emergency Department (HOSPITAL_COMMUNITY): Payer: No Typology Code available for payment source

## 2016-07-23 ENCOUNTER — Encounter (HOSPITAL_COMMUNITY): Payer: Self-pay | Admitting: Emergency Medicine

## 2016-07-23 DIAGNOSIS — S39012A Strain of muscle, fascia and tendon of lower back, initial encounter: Secondary | ICD-10-CM

## 2016-07-23 DIAGNOSIS — S161XXA Strain of muscle, fascia and tendon at neck level, initial encounter: Secondary | ICD-10-CM

## 2016-07-23 DIAGNOSIS — Y9389 Activity, other specified: Secondary | ICD-10-CM | POA: Insufficient documentation

## 2016-07-23 DIAGNOSIS — Y999 Unspecified external cause status: Secondary | ICD-10-CM | POA: Diagnosis not present

## 2016-07-23 DIAGNOSIS — F17200 Nicotine dependence, unspecified, uncomplicated: Secondary | ICD-10-CM | POA: Insufficient documentation

## 2016-07-23 DIAGNOSIS — S199XXA Unspecified injury of neck, initial encounter: Secondary | ICD-10-CM | POA: Diagnosis present

## 2016-07-23 DIAGNOSIS — Y92481 Parking lot as the place of occurrence of the external cause: Secondary | ICD-10-CM | POA: Insufficient documentation

## 2016-07-23 HISTORY — DX: Asymptomatic human immunodeficiency virus (hiv) infection status: Z21

## 2016-07-23 HISTORY — DX: Human immunodeficiency virus (HIV) disease: B20

## 2016-07-23 LAB — URINALYSIS, ROUTINE W REFLEX MICROSCOPIC
Bilirubin Urine: NEGATIVE
GLUCOSE, UA: NEGATIVE mg/dL
Hgb urine dipstick: NEGATIVE
Ketones, ur: NEGATIVE mg/dL
LEUKOCYTES UA: NEGATIVE
NITRITE: NEGATIVE
PH: 5 (ref 5.0–8.0)
PROTEIN: NEGATIVE mg/dL
Specific Gravity, Urine: 1.024 (ref 1.005–1.030)

## 2016-07-23 LAB — PREGNANCY, URINE: Preg Test, Ur: NEGATIVE

## 2016-07-23 MED ORDER — KETOROLAC TROMETHAMINE 30 MG/ML IJ SOLN
30.0000 mg | Freq: Once | INTRAMUSCULAR | Status: AC
  Administered 2016-07-23: 30 mg via INTRAMUSCULAR
  Filled 2016-07-23: qty 1

## 2016-07-23 NOTE — ED Notes (Signed)
Bed: WTR8 Expected date:  Expected time:  Means of arrival:  Comments: 

## 2016-07-23 NOTE — Discharge Instructions (Signed)
Your x-rays were negative for fracture. Your urine shows no signs of kidney trauma. This is likely musculoskeletal sprain. Have given you Toradol in the ED. Do not take any more Motrin or ibuprofen today. You may take Tylenol. May continue Motrin and Tylenol tomorrow. Continue your Flexeril you have at home. Use a heating pad to affected areas. Soak in a warm bath with Epson salt. Get plenty of rest. If her symptoms are not improving in the next few days follow up with her primary care doctor. I have given you a resource guide for primary care. I have also given you an orthopedic referral if her symptoms do not improve. Return to ED develop any worsening symptoms.
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
## 2018-04-21 ENCOUNTER — Other Ambulatory Visit: Payer: Self-pay

## 2018-04-21 ENCOUNTER — Emergency Department (HOSPITAL_BASED_OUTPATIENT_CLINIC_OR_DEPARTMENT_OTHER)
Admission: EM | Admit: 2018-04-21 | Discharge: 2018-04-21 | Disposition: A | Discharge status: Home / Self Care | Payer: Medicaid Other | Attending: Emergency Medicine | Admitting: Emergency Medicine

## 2018-04-21 ENCOUNTER — Encounter (HOSPITAL_BASED_OUTPATIENT_CLINIC_OR_DEPARTMENT_OTHER): Payer: Self-pay | Admitting: *Deleted

## 2018-04-21 DIAGNOSIS — F17210 Nicotine dependence, cigarettes, uncomplicated: Secondary | ICD-10-CM | POA: Insufficient documentation

## 2018-04-21 DIAGNOSIS — B20 Human immunodeficiency virus [HIV] disease: Secondary | ICD-10-CM | POA: Insufficient documentation

## 2018-04-21 DIAGNOSIS — Z79899 Other long term (current) drug therapy: Secondary | ICD-10-CM | POA: Insufficient documentation

## 2018-04-21 DIAGNOSIS — M25572 Pain in left ankle and joints of left foot: Secondary | ICD-10-CM | POA: Diagnosis present

## 2018-04-21 NOTE — ED Provider Notes (Signed)
[facility] EMERGENCY DEPARTMENT Provider Note   CSN: [id number] Arrival date & time: 04/21/18  1654     History   Chief Complaint Chief Complaint  Patient presents with  . Ankle Injury    HPI Natasha Winters is a 47 y.o. female with history of HIV who presents with left ankle injury that occurred 2 days ago.  Patient was evaluated at [facility] and had an x-ray which showed extensive swelling and probable avulsion fracture at the medial malleolus.  Patient was advised to get a cam walker boot which she got a medical supply yesterday, however she is unable to walk in the boot because it is too bulky and uncomfortable.  She was given crutches.  She was given hydrocodone and ibuprofen and continues to have pain.  HPI  Past Medical History:  Diagnosis Date  . HIV (human immunodeficiency virus infection) (HCC)     There are no active problems to display for this patient.   Past Surgical History:  Procedure Laterality Date  . APPENDECTOMY       OB History   None      Home Medications    Prior to Admission medications   Medication Sig Start Date End Date Taking? Authorizing Provider  doxycycline (VIBRAMYCIN) 100 MG capsule Take 1 capsule (100 mg total) by mouth 2 (two) times daily. 03/09/15   Derwood Kaplan, MD  naproxen sodium (ANAPROX) 220 MG tablet Take 220 mg by mouth 2 (two) times daily as needed (pain).     [provider]  phenazopyridine (PYRIDIUM) 200 MG tablet Take 1 tablet (200 mg total) by mouth 3 (three) times daily as needed for pain. Patient not taking: Reported on 03/09/2015 03/07/15   Doug Sou, MD    Family History No family history on file.  Social History Social History   Tobacco Use  . Smoking status: Current Some Day Smoker  . Smokeless tobacco: Never Used  Substance Use Topics  . Alcohol use: Yes    Comment: occasionally  . Drug use: No     Allergies   Patient has no known allergies.   Review of  Systems Review of Systems  Musculoskeletal: Positive for arthralgias and joint swelling.  Neurological: Negative for numbness.     Physical Exam Updated Vital Signs BP 136/81   Pulse 78   Temp 99.3 F (37.4 C) (Oral)   Resp 16   Ht 5\' 7"  (1.702 m)   Wt 114.8 kg   SpO2 100%   BMI 39.63 kg/m   Physical Exam  Constitutional: She appears well-developed and well-nourished. No distress.  HENT:  Head: Normocephalic and atraumatic.  Mouth/Throat: Oropharynx is clear and moist. No oropharyngeal exudate.  Eyes: Pupils are equal, round, and reactive to light. Conjunctivae are normal. Right eye exhibits no discharge. Left eye exhibits no discharge. No scleral icterus.  Neck: Normal range of motion. Neck supple. No thyromegaly present.  Cardiovascular: Normal rate, regular rhythm, normal heart sounds and intact distal pulses. Exam reveals no gallop and no friction rub.  No murmur heard. Pulmonary/Chest: Effort normal and breath sounds normal. No stridor. No respiratory distress. She has no wheezes. She has no rales.  Musculoskeletal: She exhibits no edema.  Significant edema and medial malleolus of left ankle, mild tenderness and edema to the lateral malleolus; DP pulses intact, sensation intact, no tenderness on palpation of the foot or proximal fibula  Lymphadenopathy:    She has no cervical adenopathy.  Neurological: She is  alert. Coordination normal.  Skin: Skin is warm and dry. No rash noted. She is not diaphoretic. No pallor.  Psychiatric: She has a normal mood and affect.  Nursing note and vitals reviewed.    ED Treatments / Results  Labs (all labs ordered are listed, but only abnormal results are displayed) Labs Reviewed - No data to display  EKG None  Radiology No results found.  Procedures Procedures (including critical care time)  Medications Ordered in ED Medications - No data to display   Initial Impression / Assessment and Plan / ED Course  I have reviewed  the triage vital signs and the nursing notes.  Pertinent labs & imaging results that were available during my care of the patient were reviewed by me and considered in my medical decision making (see chart for details).     Patient with persistent swelling and pain of left ankle injury.  Patient given a different type of cam walker boot which is more comfortable for her.  She was also provided crutches to help with comfort.  She is advised to elevate and ice.  Continue NSAIDs and hydrocodone as needed for pain.  Patient will be given referral to Dr. Pearletha Forge for further management if symptoms continue.  I discussed nonweightbearing and progress to partial weightbearing as tolerated.  She understands and agrees with plan.  Patient vitals stable throughout ED course and discharged in satisfactory condition.  Final Clinical Impressions(s) / ED Diagnoses   Final diagnoses:  Acute left ankle pain    ED Discharge Orders    None       Emi Holes, PA-C 04/21/18 1827    Little, Ambrose Finland, MD 04/21/18 2259

## 2018-04-21 NOTE — Discharge Instructions (Signed)
Continue taking ibuprofen as prescribed regularly to help with swelling and pain.  Use ice 3-4 times daily alternating 20 minutes on, 20 minutes off.  Please follow-up with orthopedic doctor if your symptoms are not improving over the next 7 to 10 days.  I recommend nonweightbearing as much as possible and progress to partial weightbearing as tolerated.  You can use the boot to help provide support and ambulate whenever you are unable to use crutches.  Please return the emergency department if you develop any new or worsening symptoms.
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
## 2018-11-24 ENCOUNTER — Other Ambulatory Visit: Payer: Self-pay

## 2018-11-24 ENCOUNTER — Emergency Department (HOSPITAL_BASED_OUTPATIENT_CLINIC_OR_DEPARTMENT_OTHER)
Admission: EM | Admit: 2018-11-24 | Discharge: 2018-11-24 | Disposition: A | Discharge status: Home / Self Care | Payer: Medicaid Other | Attending: Emergency Medicine | Admitting: Emergency Medicine

## 2018-11-24 ENCOUNTER — Encounter (HOSPITAL_BASED_OUTPATIENT_CLINIC_OR_DEPARTMENT_OTHER): Payer: Self-pay | Admitting: Emergency Medicine

## 2018-11-24 DIAGNOSIS — Z21 Asymptomatic human immunodeficiency virus [HIV] infection status: Secondary | ICD-10-CM | POA: Insufficient documentation

## 2018-11-24 DIAGNOSIS — F17200 Nicotine dependence, unspecified, uncomplicated: Secondary | ICD-10-CM | POA: Insufficient documentation

## 2018-11-24 DIAGNOSIS — Z79899 Other long term (current) drug therapy: Secondary | ICD-10-CM | POA: Insufficient documentation

## 2018-11-24 DIAGNOSIS — M722 Plantar fascial fibromatosis: Secondary | ICD-10-CM | POA: Insufficient documentation

## 2018-11-24 MED ORDER — PREDNISONE 20 MG PO TABS: 40.0000 mg | ORAL_TABLET | Freq: Every day | ORAL | 0 refills | Status: DC

## 2018-11-24 NOTE — Discharge Instructions (Signed)
Make sure you get a good insole for your shoe and wear a shoe at all times.  Take Tylenol for the pain as well as the steroids for the inflammation.  Because of the other medication you are taking you cannot have ibuprofen-like products
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
## 2018-11-24 NOTE — ED Provider Notes (Signed)
[place] EMERGENCY DEPARTMENT Provider Note   CSN: [id number] Arrival date & time: 11/24/18  1884    History   Chief Complaint Chief Complaint  Patient presents with  . Foot Pain    HPI Natasha Winters is a 48 y.o. female.     The history is provided by the patient.  Foot Pain  This is a new problem. Episode onset: 3 days ago. The problem occurs constantly. The problem has been gradually worsening. Associated symptoms comments: Pain at the bottom of the right foot that is present when she is walking or standing.  Pain is completely relieved with sitting down.  Really bad in the morning when she gets up and tries to walk from her bedroom.  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  She denies any trauma.  No prior symptoms of similar.. The symptoms are aggravated by walking and standing. The symptoms are relieved by rest. She has tried rest for the symptoms. The treatment provided no relief.    Past Medical History:  Diagnosis Date  . HIV (human immunodeficiency virus infection) ([place])     There are no active problems to display for this patient.   Past Surgical History:  Procedure Laterality Date  . APPENDECTOMY       OB History   No obstetric history on file.      Home Medications    Prior to Admission medications   Medication Sig Start Date End Date Taking? Authorizing Provider  elvitegravir-cobicistat-emtricitabine-tenofovir (GENVOYA) 150-150-200-10 MG TABS tablet Take by mouth. 01/31/17  Yes [provider]  doxycycline (VIBRAMYCIN) 100 MG capsule Take 1 capsule (100 mg total) by mouth 2 (two) times daily. 03/09/15   Varney Biles, MD  naproxen sodium (ANAPROX) 220 MG tablet Take 220 mg by mouth 2 (two) times daily as needed (pain).     [provider]  phenazopyridine (PYRIDIUM) 200 MG tablet Take 1 tablet (200 mg total) by mouth 3 (three) times daily as needed for pain.  Patient not taking: Reported on 03/09/2015 03/07/15   Orlie Dakin, MD  predniSONE (DELTASONE) 20 MG tablet Take 2 tablets (40 mg total) by mouth daily. 11/24/18   Blanchie Dessert, MD    Family History History reviewed. No pertinent family history.  Social History Social History   Tobacco Use  . Smoking status: Current Some Day Smoker  . Smokeless tobacco: Never Used  Substance Use Topics  . Alcohol use: Yes    Comment: occasionally  . Drug use: No     Allergies   Patient has no known allergies.   Review of Systems Review of Systems  All other systems reviewed and are negative.    Physical Exam Updated Vital Signs BP (!) 142/97 (BP Location: Left Arm)   Pulse 78   Temp 98.4 F (36.9 C) (Oral)   Resp 16   Ht 5\' 7"  (1.702 m)   Wt 117.9 kg   SpO2 99%   BMI 40.72 kg/m   Physical Exam Nursing note reviewed. Exam conducted with a chaperone present.  Constitutional:      General: She is not in acute distress.    Appearance: She is obese.  HENT:     Head: Normocephalic.  Cardiovascular:     Rate and Rhythm: Normal rate.     Pulses: Normal pulses.  Pulmonary:     Effort: Pulmonary effort is normal.  Musculoskeletal:  General: Tenderness present.     Right ankle: Normal.     Right lower leg: No edema.       Feet:  Skin:    Capillary Refill: Capillary refill takes less than 2 seconds.     Findings: No lesion or rash.  Neurological:     General: No focal deficit present.     Mental Status: She is alert. Mental status is at baseline.  Psychiatric:        Mood and Affect: Mood normal.      ED Treatments / Results  Labs (all labs ordered are listed, but only abnormal results are displayed) Labs Reviewed - No data to display  EKG None  Radiology No results found.  Procedures Procedures (including critical care time)  Medications Ordered in ED Medications - No data to display   Initial Impression / Assessment and Plan / ED Course  I  have reviewed the triage vital signs and the nursing notes.  Pertinent labs & imaging results that were available during my care of the patient were reviewed by me and considered in my medical decision making (see chart for details).        Patient with symptoms classic for plantar fasciitis in her right foot.  Discussed with her arch support and taking Tylenol for pain.  She cannot have anti-inflammatories because of the HIV medication she is on.  She is well controlled with her HIV and has undetectable viral load.  No infectious symptoms present and low suspicion for gout or bony issue as she has had no trauma.  Will give a course of steroids to help with inflammation.  Gave follow-up if arch supports and steroids did not help.  Final Clinical Impressions(s) / ED Diagnoses   Final diagnoses:  Plantar fasciitis of right foot    ED Discharge Orders         Ordered    predniSONE (DELTASONE) 20 MG tablet  Daily     11/24/18 [id number]           Gwyneth SproutPlunkett, Jancarlos Thrun, MD 11/24/18 1030

## 2018-11-24 NOTE — ED Notes (Signed)
ED Provider at bedside. 

## 2019-10-06 ENCOUNTER — Other Ambulatory Visit: Payer: Self-pay

## 2019-10-06 ENCOUNTER — Encounter (HOSPITAL_BASED_OUTPATIENT_CLINIC_OR_DEPARTMENT_OTHER): Payer: Self-pay

## 2019-10-06 ENCOUNTER — Emergency Department (HOSPITAL_BASED_OUTPATIENT_CLINIC_OR_DEPARTMENT_OTHER)
Admission: EM | Admit: 2019-10-06 | Discharge: 2019-10-06 | Disposition: A | Discharge status: Home / Self Care | Payer: BC Managed Care – PPO | Attending: Emergency Medicine | Admitting: Emergency Medicine

## 2019-10-06 DIAGNOSIS — R197 Diarrhea, unspecified: Secondary | ICD-10-CM | POA: Diagnosis not present

## 2019-10-06 DIAGNOSIS — F17210 Nicotine dependence, cigarettes, uncomplicated: Secondary | ICD-10-CM | POA: Diagnosis not present

## 2019-10-06 DIAGNOSIS — R1084 Generalized abdominal pain: Secondary | ICD-10-CM | POA: Diagnosis not present

## 2019-10-06 DIAGNOSIS — Z79899 Other long term (current) drug therapy: Secondary | ICD-10-CM | POA: Insufficient documentation

## 2019-10-06 DIAGNOSIS — R112 Nausea with vomiting, unspecified: Secondary | ICD-10-CM | POA: Insufficient documentation

## 2019-10-06 DIAGNOSIS — R109 Unspecified abdominal pain: Secondary | ICD-10-CM

## 2019-10-06 DIAGNOSIS — B20 Human immunodeficiency virus [HIV] disease: Secondary | ICD-10-CM | POA: Insufficient documentation

## 2019-10-06 DIAGNOSIS — Z881 Allergy status to other antibiotic agents status: Secondary | ICD-10-CM | POA: Diagnosis not present

## 2019-10-06 LAB — CBC WITH DIFFERENTIAL/PLATELET
Abs Immature Granulocytes: 0.02 10*3/uL (ref 0.00–0.07)
Basophils Absolute: 0 10*3/uL (ref 0.0–0.1)
Basophils Relative: 0 %
Eosinophils Absolute: 0.2 10*3/uL (ref 0.0–0.5)
Eosinophils Relative: 2 %
HCT: 46.4 % — ABNORMAL HIGH (ref 36.0–46.0)
Hemoglobin: 14.1 g/dL (ref 12.0–15.0)
Immature Granulocytes: 0 %
Lymphocytes Relative: 9 %
Lymphs Abs: 0.9 10*3/uL (ref 0.7–4.0)
MCH: 24.7 pg — ABNORMAL LOW (ref 26.0–34.0)
MCHC: 30.4 g/dL (ref 30.0–36.0)
MCV: 81.3 fL (ref 80.0–100.0)
Monocytes Absolute: 0.4 10*3/uL (ref 0.1–1.0)
Monocytes Relative: 4 %
Neutro Abs: 8.2 10*3/uL — ABNORMAL HIGH (ref 1.7–7.7)
Neutrophils Relative %: 85 %
Platelets: 269 10*3/uL (ref 150–400)
RBC: 5.71 MIL/uL — ABNORMAL HIGH (ref 3.87–5.11)
RDW: 16 % — ABNORMAL HIGH (ref 11.5–15.5)
WBC: 9.8 10*3/uL (ref 4.0–10.5)
nRBC: 0 % (ref 0.0–0.2)

## 2019-10-06 LAB — COMPREHENSIVE METABOLIC PANEL
ALT: 30 U/L (ref 0–44)
AST: 28 U/L (ref 15–41)
Albumin: 4 g/dL (ref 3.5–5.0)
Alkaline Phosphatase: 92 U/L (ref 38–126)
Anion gap: 10 (ref 5–15)
BUN: 13 mg/dL (ref 6–20)
CO2: 24 mmol/L (ref 22–32)
Calcium: 8.8 mg/dL — ABNORMAL LOW (ref 8.9–10.3)
Chloride: 105 mmol/L (ref 98–111)
Creatinine, Ser: 0.59 mg/dL (ref 0.44–1.00)
GFR calc Af Amer: >60 mL/min (ref >60)
GFR calc non Af Amer: >60 mL/min (ref >60)
Glucose, Bld: 134 mg/dL — ABNORMAL HIGH (ref 70–99)
Potassium: 3.9 mmol/L (ref 3.5–5.1)
Sodium: 139 mmol/L (ref 135–145)
Total Bilirubin: 1.1 mg/dL (ref 0.3–1.2)
Total Protein: 7.9 g/dL (ref 6.5–8.1)

## 2019-10-06 LAB — LIPASE, BLOOD: Lipase: 22 U/L (ref 11–51)

## 2019-10-06 LAB — PREGNANCY, URINE: Preg Test, Ur: NEGATIVE

## 2019-10-06 LAB — CBG MONITORING, ED: Glucose-Capillary: 97 mg/dL (ref 70–99)

## 2019-10-06 MED ORDER — ONDANSETRON HCL 4 MG PO TABS: 4.0000 mg | ORAL_TABLET | Freq: Three times a day (TID) | ORAL | 0 refills | Status: DC | PRN

## 2019-10-06 MED ORDER — ONDANSETRON HCL 4 MG/2ML IJ SOLN
4.0000 mg | Freq: Once | INTRAMUSCULAR | Status: AC
  Administered 2019-10-06: 4 mg via INTRAVENOUS
  Filled 2019-10-06: qty 2

## 2019-10-06 MED ORDER — SODIUM CHLORIDE 0.9 % IV BOLUS: 1000.0000 mL | Freq: Once | INTRAVENOUS | Status: DC

## 2019-10-06 NOTE — ED Provider Notes (Signed)
[place] EMERGENCY DEPARTMENT Provider Note   CSN: [id number] Arrival date & time: 10/06/19  1105     History Chief Complaint  Patient presents with  . Abdominal Pain    Natasha Winters is a 49 y.o. female presenting for evaluation of nausea, vomiting, diarrhea, abdominal pain.  Patient states last night she ate hamburger helper, and very soon afterwards she developed nausea, vomiting, diarrhea.  She reports taking Pepto-Bismol, and had an episode of emesis, she has not had any emesis since.  There is no blood in her emesis.  Patient has had multiple bowel movements, which she describes as loose, watery, brown.  No blood in her stool.  She reports approximately 5 bowel movements since last night.  Patient was having upper abdominal pain, but this resolved prior to arrival in the ED.  Patient states her pain was described as a cramping/spasm.  She is not taking anything for her symptoms.  She is tolerating ginger ale, but has not had any solid p.o. she states she is feeling hungry however at this time now that her pain has improved.  She denies fevers, chills, chest pain, shortness breath, cough, urinary symptoms.  She has a history of diabetes and HIV, both of which are well controlled.  Patient's daughter has similar GI symptoms.   HPI     Past Medical History:  Diagnosis Date  . HIV (human immunodeficiency virus infection) ([place])     There are no problems to display for this patient.   Past Surgical History:  Procedure Laterality Date  . APPENDECTOMY       OB History   No obstetric history on file.     No family history on file.  Social History   Tobacco Use  . Smoking status: Current Some Day Smoker  . Smokeless tobacco: Never Used  Substance Use Topics  . Alcohol use: Yes    Comment: occasionally  . Drug use: No    Home Medications Prior to Admission medications   Medication Sig Start Date End Date Taking? Authorizing Provider    elvitegravir-cobicistat-emtricitabine-tenofovir (GENVOYA) 150-150-200-10 MG TABS tablet Take by mouth. 01/31/17  Yes [provider]  ondansetron (ZOFRAN) 4 MG tablet Take 1 tablet (4 mg total) by mouth every 8 (eight) hours as needed for nausea or vomiting. 10/06/19   Memory Heinrichs, PA-C    Allergies    Doxycycline  Review of Systems   Review of Systems  Gastrointestinal: Positive for abdominal pain (Resolved), diarrhea, nausea (Resolved) and vomiting.  All other systems reviewed and are negative.   Physical Exam Updated Vital Signs BP (!) 127/92 (BP Location: Left Wrist)   Pulse 82   Temp 98.5 F (36.9 C) (Oral)   Resp 16   Ht 5\' 3"  (1.6 m)   Wt 120.2 kg   LMP 11/22/2013 Comment: not preg, not sexually active  SpO2 97%   BMI 46.94 kg/m   Physical Exam Vitals and nursing note reviewed.  Constitutional:      General: She is not in acute distress.    Appearance: She is well-developed. She is obese.     Comments: Obese female resting comfortably in the bed no acute distress  HENT:     Head: Normocephalic and atraumatic.  Eyes:     Extraocular Movements: Extraocular movements intact.     Conjunctiva/sclera: Conjunctivae normal.     Pupils: Pupils are equal, round, and reactive to light.  Cardiovascular:     Rate and Rhythm: Normal rate  and regular rhythm.     Pulses: Normal pulses.  Pulmonary:     Effort: Pulmonary effort is normal. No respiratory distress.     Breath sounds: Normal breath sounds. No wheezing.  Abdominal:     General: There is no distension.     Palpations: Abdomen is soft. There is no mass.     Tenderness: There is no abdominal tenderness. There is no guarding or rebound.     Comments: No tenderness palpation the abdomen.  No rigidity, guarding, distention.  Negative rebound.  No peritonitis.  Musculoskeletal:        General: Normal range of motion.     Cervical back: Normal range of motion and neck supple.     Right lower leg: No  edema.     Left lower leg: No edema.  Skin:    General: Skin is warm and dry.     Capillary Refill: Capillary refill takes less than 2 seconds.  Neurological:     Mental Status: She is alert and oriented to person, place, and time.     ED Results / Procedures / Treatments   Labs (all labs ordered are listed, but only abnormal results are displayed) Labs Reviewed  CBC WITH DIFFERENTIAL/PLATELET - Abnormal; Notable for the following components:      Result Value   RBC 5.71 (*)    HCT 46.4 (*)    MCH 24.7 (*)    RDW 16.0 (*)    Neutro Abs 8.2 (*)    All other components within normal limits  COMPREHENSIVE METABOLIC PANEL - Abnormal; Notable for the following components:   Glucose, Bld 134 (*)    Calcium 8.8 (*)    All other components within normal limits  LIPASE, BLOOD  PREGNANCY, URINE  CBG MONITORING, ED    EKG None  Radiology No results found.  Procedures Procedures (including critical care time)  Medications Ordered in ED Medications  ondansetron (ZOFRAN) injection 4 mg (4 mg Intravenous Given 10/06/19 1240)    ED Course  I have reviewed the triage vital signs and the nursing notes.  Pertinent labs & imaging results that were available during my care of the patient were reviewed by me and considered in my medical decision making (see chart for details).    MDM Rules/Calculators/A&P                      Patient presented for evaluation of nausea, vomiting, diarrhea, abdominal pain.  On exam, patient appears nontoxic.  Her abdominal pain is since resolved.  She has no abdominal tenderness on exam.  She is drinking ginger ale upon my evaluation without difficulty.  Patient states she is feeling much better, and her daughter who is also sick and pregnant will need to be driven to the OB.  As such, patient is requesting discharge.  I discussed with patient options of labs, treatment, and p.o. challenge.  Patient would like Zofran.  Labs have already been collected,  but results are not returned.  Patient is aware that we do not have all the information, she could have a serious or life-threatening condition.  However, as patient symptoms are improving and she is tolerating p.o., I have low suspicion for emergent condition, and agreeable to discharge.  Patient encouraged to return to the ER with any worsening symptoms.  At this time, patient received a discharge.  Precautions given.  Patient states he understands agrees plan.  Labs resulted after patient discharge.  Reviewed  by me.  Overall reassuring.  No leukocytosis.  Electrolytes stable.  Final Clinical Impression(s) / ED Diagnoses Final diagnoses:  Nausea vomiting and diarrhea  Abdominal cramping    Rx / DC Orders ED Discharge Orders         Ordered    ondansetron (ZOFRAN) 4 MG tablet  Every 8 hours PRN     10/06/19 1242           Jori Frerichs, PA-C 10/06/19 1356    Geoffery Lyons, MD 10/07/19 [phone]

## 2019-10-06 NOTE — ED Triage Notes (Signed)
Pt c/o abd pain, n/v/d started yesterday-c/o cough "when I get hot"-NAD-steady gait

## 2019-10-06 NOTE — Discharge Instructions (Signed)
You Zofran as needed for nausea or vomiting. Make sure you are staying well-hydrated water. Eat a bland diet until your symptoms are improving. You may continue to have diarrhea over the next several days, this is normal. Return to the emergency room if you develop fevers, persistent vomiting despite medication, severe worsening abdominal pain, blood in your stool, or any new, worsening, concerning symptoms.

## 2020-03-13 ENCOUNTER — Encounter (HOSPITAL_BASED_OUTPATIENT_CLINIC_OR_DEPARTMENT_OTHER): Payer: Self-pay | Admitting: *Deleted

## 2020-03-13 ENCOUNTER — Other Ambulatory Visit: Payer: Self-pay

## 2020-03-13 ENCOUNTER — Emergency Department (HOSPITAL_BASED_OUTPATIENT_CLINIC_OR_DEPARTMENT_OTHER)
Admission: EM | Admit: 2020-03-13 | Discharge: 2020-03-13 | Disposition: A | Discharge status: Home / Self Care | Payer: BC Managed Care – PPO | Attending: Emergency Medicine | Admitting: Emergency Medicine

## 2020-03-13 DIAGNOSIS — M25561 Pain in right knee: Secondary | ICD-10-CM | POA: Diagnosis not present

## 2020-03-13 DIAGNOSIS — G8929 Other chronic pain: Secondary | ICD-10-CM

## 2020-03-13 DIAGNOSIS — F17200 Nicotine dependence, unspecified, uncomplicated: Secondary | ICD-10-CM | POA: Diagnosis not present

## 2020-03-13 MED ORDER — NAPROXEN 375 MG PO TABS: 375.0000 mg | ORAL_TABLET | Freq: Two times a day (BID) | ORAL | 0 refills | Status: AC

## 2020-03-13 MED ORDER — KETOROLAC TROMETHAMINE 15 MG/ML IJ SOLN
15.0000 mg | Freq: Once | INTRAMUSCULAR | Status: AC
  Administered 2020-03-13: 15 mg via INTRAMUSCULAR
  Filled 2020-03-13: qty 1

## 2020-03-13 NOTE — Discharge Instructions (Signed)
Is withTake naproxen 2 times a day with meals.  Do not take other anti-inflammatories at the same time (Advil, Motrin, ibuprofen, Aleve). You may supplement with Tylenol if you need further pain control. Use the knee sleeve/brace to help with pain control and support as needed. Use ice to up with pain and swelling. Try to start moving/walking, as movement can actually make the pain better. Follow-up with orthopedic doctor as needed for further evaluation. Return to the emergency room if you develop fevers, redness and swelling of your knee, inability to walk, numbness of your leg, any new, worsening, or concerning symptoms.

## 2020-03-13 NOTE — ED Triage Notes (Signed)
C/O right knee pain x 2 month , seen [facility] for same last month referred to ortho, missed ortho appt today

## 2020-03-13 NOTE — ED Provider Notes (Signed)
[facility] EMERGENCY DEPARTMENT Provider Note   CSN: [id number] Arrival date & time: 03/13/20  1508     History Chief Complaint  Patient presents with  . Knee Pain    Natasha Winters is a 49 y.o. female presenting for evaluation of right knee pain and swelling.  Patient states the past 5 months, she has had intermittent right knee pain.  Today her knee pain is worse, swelling is worse, prompting her ER visit.  She was post to see orthopedics today, but cannot pay the co-pay and thus did not go.  She denies new trauma or injury.  She has not taken anything for it including Tylenol ibuprofen.  She denies numbness or tingling.  No symptoms on the left side.  She denies fevers, chills, redness or warmth of the knee.  Pain is present only with ambulation, no pain at rest.  Does not radiate.    Additional history obtained from chart review.  Reviewed imaging from [facility] from 2 months ago, where patient had an x-ray which showed arthritis with a small joint effusion.  HPI     Past Medical History:  Diagnosis Date  . HIV (human immunodeficiency virus infection) ([facility])     There are no problems to display for this patient.   Past Surgical History:  Procedure Laterality Date  . APPENDECTOMY       OB History   No obstetric history on file.     No family history on file.  Social History   Tobacco Use  . Smoking status: Current Some Day Smoker  . Smokeless tobacco: Never Used  Vaping Use  . Vaping Use: Never used  Substance Use Topics  . Alcohol use: Yes    Comment: occasionally  . Drug use: No    Home Medications Prior to Admission medications   Medication Sig Start Date End Date Taking? Authorizing Provider  elvitegravir-cobicistat-emtricitabine-tenofovir (GENVOYA) 150-150-200-10 MG TABS tablet Take by mouth. 01/31/17   [provider]  naproxen (NAPROSYN) 375 MG tablet Take 1 tablet (375 mg total) by mouth 2 (two) times daily with a meal  for 7 days. 03/13/20 03/20/20  Mike Hamre, PA-C  ondansetron (ZOFRAN) 4 MG tablet Take 1 tablet (4 mg total) by mouth every 8 (eight) hours as needed for nausea or vomiting. 10/06/19   Bentli Llorente, PA-C    Allergies    Doxycycline  Review of Systems   Review of Systems  Musculoskeletal: Positive for arthralgias and joint swelling.  Neurological: Negative for numbness.    Physical Exam Updated Vital Signs BP (!) 126/93 (BP Location: Right Arm)   Pulse 76   Temp 98.3 F (36.8 C)   Resp 18   Ht 5\' 7"  (1.702 m)   Wt 117.9 kg   LMP 11/22/2013 Comment: not preg, not sexually active  SpO2 100%   BMI 40.72 kg/m   Physical Exam Vitals and nursing note reviewed.  Constitutional:      General: She is not in acute distress.    Appearance: She is well-developed.  HENT:     Head: Normocephalic and atraumatic.  Pulmonary:     Effort: Pulmonary effort is normal.  Abdominal:     General: There is no distension.  Musculoskeletal:        General: Swelling and tenderness present. Normal range of motion.     Cervical back: Normal range of motion.     Comments: Mild swelling of the right knee when compared to the left.  No erythema or warmth.  Tenderness along the medial knee.  No obvious effusion on exam.  No tenderness palpation of the calf or the thigh.  Pedal pulses 2+ bilaterally.  Patient ambulatory.  Skin:    General: Skin is warm.     Capillary Refill: Capillary refill takes less than 2 seconds.     Findings: No rash.  Neurological:     Mental Status: She is alert and oriented to person, place, and time.     ED Results / Procedures / Treatments   Labs (all labs ordered are listed, but only abnormal results are displayed) Labs Reviewed - No data to display  EKG None  Radiology No results found.  Procedures Procedures (including critical care time)  Medications Ordered in ED Medications  ketorolac (TORADOL) 15 MG/ML injection 15 mg (has no administration  in time range)    ED Course  I have reviewed the triage vital signs and the nursing notes.  Pertinent labs & imaging results that were available during my care of the patient were reviewed by me and considered in my medical decision making (see chart for details).    MDM Rules/Calculators/A&P                          Patient presenting for evaluation of intermittent right knee pain for several months, worsened today.  On exam, patient appears nontoxic.  She does have mild swelling, but no erythema or warmth.  No fevers or chills.  Doubt septic joint.  She is neurovascularly intact.  She had an x-ray several months ago which showed arthritis, I do not believe she needs repeat x-ray as she has had no new trauma or injury, doubt fracture dislocation.  Discussed Intermatic treatment for arthritis.  Will give Toradol shot in the ER as well as knee sleeve.  Will have patient take a week of naproxen for pain and swelling control.  Encourage follow-up with orthopedics.  At this time, patient appears safe for discharge.  Return precautions given.  Patient states she understands and agrees to plan.  Final Clinical Impression(s) / ED Diagnoses Final diagnoses:  Chronic pain of right knee    Rx / DC Orders ED Discharge Orders         Ordered    naproxen (NAPROSYN) 375 MG tablet  2 times daily with meals        03/13/20 1615           Richie Bonanno, PA-C 03/13/20 1630    Little, Ambrose Finland, MD 03/13/20 1757

## 2020-05-02 ENCOUNTER — Emergency Department (HOSPITAL_BASED_OUTPATIENT_CLINIC_OR_DEPARTMENT_OTHER)
Admission: EM | Admit: 2020-05-02 | Discharge: 2020-05-02 | Disposition: A | Discharge status: Home / Self Care | Payer: BC Managed Care – PPO | Attending: Emergency Medicine | Admitting: Emergency Medicine

## 2020-05-02 ENCOUNTER — Encounter (HOSPITAL_BASED_OUTPATIENT_CLINIC_OR_DEPARTMENT_OTHER): Payer: Self-pay | Admitting: *Deleted

## 2020-05-02 ENCOUNTER — Other Ambulatory Visit: Payer: Self-pay

## 2020-05-02 DIAGNOSIS — F17200 Nicotine dependence, unspecified, uncomplicated: Secondary | ICD-10-CM | POA: Diagnosis not present

## 2020-05-02 DIAGNOSIS — Z20822 Contact with and (suspected) exposure to covid-19: Secondary | ICD-10-CM | POA: Insufficient documentation

## 2020-05-02 DIAGNOSIS — J028 Acute pharyngitis due to other specified organisms: Secondary | ICD-10-CM | POA: Insufficient documentation

## 2020-05-02 DIAGNOSIS — B9789 Other viral agents as the cause of diseases classified elsewhere: Secondary | ICD-10-CM | POA: Diagnosis not present

## 2020-05-02 DIAGNOSIS — J029 Acute pharyngitis, unspecified: Secondary | ICD-10-CM

## 2020-05-02 DIAGNOSIS — J069 Acute upper respiratory infection, unspecified: Secondary | ICD-10-CM | POA: Diagnosis not present

## 2020-05-02 DIAGNOSIS — R059 Cough, unspecified: Secondary | ICD-10-CM | POA: Diagnosis present

## 2020-05-02 LAB — RESPIRATORY PANEL BY RT PCR (FLU A&B, COVID)
Influenza A by PCR: NEGATIVE
Influenza B by PCR: NEGATIVE
SARS Coronavirus 2 by RT PCR: NEGATIVE

## 2020-05-02 LAB — GROUP A STREP BY PCR: Group A Strep by PCR: NOT DETECTED

## 2020-05-02 MED ORDER — BENZONATATE 100 MG PO CAPS: 100.0000 mg | ORAL_CAPSULE | Freq: Three times a day (TID) | ORAL | 0 refills | Status: DC

## 2020-05-02 NOTE — ED Triage Notes (Signed)
Cough, sore throat and runny nose. Her grandson was seen for same yesterday and his Covid test was negative. No known Covid contacts.

## 2020-05-02 NOTE — Discharge Instructions (Signed)
Your Covid, flu and strep test were all negative today. Take the [facility] as needed for cough. Make sure you are drinking plenty of fluids. Tylenol as needed for pain or fever. Return to the ER if you start to experience trouble breathing, coughing up blood, chest pain or shortness of breath.

## 2020-05-02 NOTE — ED Provider Notes (Signed)
[facility] EMERGENCY DEPARTMENT Provider Note   CSN: [id number] Arrival date & time: 05/02/20  1625     History Chief Complaint  Patient presents with  . Sore Throat  . Cough    Natasha Winters is a 49 y.o. female with a past medical history of HIV on Genvoya presenting to the ED with a chief complaint of cough, sore throat and rhinorrhea.  Symptoms began yesterday.  Reports congestion and dry cough.  States that her grandson was seen here yesterday for similar symptoms with a negative Covid test.  She denies any known Covid exposures.  Has not been vaccinated against Covid.  She denies any chest pain, abdominal pain, vomiting, diarrhea.  She has not tried any medications to help with her symptoms.  HPI     Past Medical History:  Diagnosis Date  . HIV (human immunodeficiency virus infection) (HCC)     There are no problems to display for this patient.   Past Surgical History:  Procedure Laterality Date  . APPENDECTOMY       OB History   No obstetric history on file.     No family history on file.  Social History   Tobacco Use  . Smoking status: Current Some Day Smoker  . Smokeless tobacco: Never Used  Vaping Use  . Vaping Use: Never used  Substance Use Topics  . Alcohol use: Yes    Comment: occasionally  . Drug use: No    Home Medications Prior to Admission medications   Medication Sig Start Date End Date Taking? Authorizing Provider  conjugated estrogens (PREMARIN) vaginal cream Place vaginally. 03/17/20  Yes [provider]  DULoxetine (CYMBALTA) 30 MG capsule Take 1 tablet by mouth daily. 04/10/20  Yes [provider]  elvitegravir-cobicistat-emtricitabine-tenofovir (GENVOYA) 150-150-200-10 MG TABS tablet Take by mouth. 01/31/17  Yes [provider]  ibuprofen (ADVIL) 800 MG tablet Take by mouth. 07/20/16  Yes [provider]  metFORMIN (GLUCOPHAGE) 500 MG tablet Take by mouth. 03/17/20  Yes [provider]  benzonatate (TESSALON) 100 MG capsule Take 1 capsule (100 mg total) by mouth every 8 (eight) hours. 05/02/20   Mertice Uffelman, PA-C  ondansetron (ZOFRAN) 4 MG tablet Take 1 tablet (4 mg total) by mouth every 8 (eight) hours as needed for nausea or vomiting. 10/06/19   Caccavale, Sophia, PA-C    Allergies    Doxycycline  Review of Systems   Review of Systems  Constitutional: Negative for chills and fever.  HENT: Positive for congestion, rhinorrhea and sore throat.   Respiratory: Positive for cough. Negative for shortness of breath.   Cardiovascular: Negative for chest pain.  Gastrointestinal: Negative for abdominal pain, diarrhea and vomiting.    Physical Exam Updated Vital Signs BP (!) 130/91   Pulse 71   Temp 98.1 F (36.7 C) (Oral)   Resp 20   Ht 5\' 7"  (1.702 m)   Wt 121.3 kg   LMP 11/22/2013 Comment: not preg, not sexually active  SpO2 97%   BMI 41.88 kg/m   Physical Exam Vitals and nursing note reviewed.  Constitutional:      General: She is not in acute distress.    Appearance: She is well-developed. She is not diaphoretic.  HENT:     Head: Normocephalic and atraumatic.     Mouth/Throat:     Pharynx: Uvula midline. Posterior oropharyngeal erythema present. No uvula swelling.     Tonsils: 1+ on the right. 1+ on the left.  Comments: Patient does not appear to be in acute distress. No trismus or drooling present. No pooling of secretions. Patient is tolerating secretions and is not in respiratory distress. No neck pain or tenderness to palpation of the neck. Full active and passive range of motion of the neck. No evidence of RPA or PTA. Eyes:     General: No scleral icterus.    Conjunctiva/sclera: Conjunctivae normal.  Cardiovascular:     Rate and Rhythm: Normal rate and regular rhythm.     Heart sounds: Normal heart sounds.  Pulmonary:     Effort: Pulmonary effort is normal. No respiratory distress.  Musculoskeletal:     Cervical back: Normal range of motion.   Skin:    Findings: No rash.  Neurological:     Mental Status: She is alert.     ED Results / Procedures / Treatments   Labs (all labs ordered are listed, but only abnormal results are displayed) Labs Reviewed  RESPIRATORY PANEL BY RT PCR (FLU A&B, COVID)  GROUP A STREP BY PCR    EKG None  Radiology No results found.  Procedures Procedures (including critical care time)  Medications Ordered in ED Medications - No data to display  ED Course  I have reviewed the triage vital signs and the nursing notes.  Pertinent labs & imaging results that were available during my care of the patient were reviewed by me and considered in my medical decision making (see chart for details).    MDM Rules/Calculators/A&P                          Natasha Winters was evaluated in Emergency Department on 05/02/20 for the symptoms described in the history of present illness. He/she was evaluated in the context of the global COVID-19 pandemic, which necessitated consideration that the patient might be at risk for infection with the SARS-CoV-2 virus that causes COVID-19. Institutional protocols and algorithms that pertain to the evaluation of patients at risk for COVID-19 are in a state of rapid change based on information released by regulatory bodies including the CDC and federal and state organizations. These policies and algorithms were followed during the patient's care in the ED.  Pt rapid strep test negative.  Covid and flu test are negative as well.  Pt is tolerating secretions, not in respiratory distress, no neck pain, no trismus. Presentation not concerning for peritonsillar abscess or spread of infection to deep spaces of the throat; patent airway. Pt will be discharged with Tessalon to help with cough.  Suspect viral cause of all symptoms.  Ibuprofen or Tylenol as needed for pain/fever. Specific return precautions discussed. Recommended PCP follow up. Pt appears safe for discharge.     Patient is hemodynamically stable, in NAD, and able to ambulate in the ED. Evaluation does not show pathology that would require ongoing emergent intervention or inpatient treatment. I explained the diagnosis to the patient. Pain has been managed and has no complaints prior to discharge. Patient is comfortable with above plan and is stable for discharge at this time. All questions were answered prior to disposition. Strict return precautions for returning to the ED were discussed. Encouraged follow up with PCP.   An After Visit Summary was printed and given to the patient.   Portions of this note were generated with Scientist, clinical (histocompatibility and immunogenetics). Dictation errors may occur despite best attempts at proofreading.  Final Clinical Impression(s) / ED Diagnoses Final diagnoses:  Viral URI with  cough  Viral pharyngitis    Rx / DC Orders ED Discharge Orders         Ordered    benzonatate (TESSALON) 100 MG capsule  Every 8 hours        05/02/20 1829           Dietrich Pates, PA-C 05/02/20 1830    Tegeler, Canary Brim, MD 05/02/20 [phone]

## 2020-06-30 ENCOUNTER — Emergency Department (HOSPITAL_BASED_OUTPATIENT_CLINIC_OR_DEPARTMENT_OTHER)
Admission: EM | Admit: 2020-06-30 | Discharge: 2020-06-30 | Disposition: A | Discharge status: Home / Self Care | Payer: BC Managed Care – PPO | Attending: Emergency Medicine | Admitting: Emergency Medicine

## 2020-06-30 ENCOUNTER — Other Ambulatory Visit: Payer: Self-pay

## 2020-06-30 ENCOUNTER — Encounter (HOSPITAL_BASED_OUTPATIENT_CLINIC_OR_DEPARTMENT_OTHER): Payer: Self-pay

## 2020-06-30 DIAGNOSIS — J069 Acute upper respiratory infection, unspecified: Secondary | ICD-10-CM | POA: Insufficient documentation

## 2020-06-30 DIAGNOSIS — Z21 Asymptomatic human immunodeficiency virus [HIV] infection status: Secondary | ICD-10-CM | POA: Diagnosis not present

## 2020-06-30 DIAGNOSIS — Z20822 Contact with and (suspected) exposure to covid-19: Secondary | ICD-10-CM

## 2020-06-30 DIAGNOSIS — U071 COVID-19: Secondary | ICD-10-CM | POA: Insufficient documentation

## 2020-06-30 DIAGNOSIS — F17200 Nicotine dependence, unspecified, uncomplicated: Secondary | ICD-10-CM | POA: Insufficient documentation

## 2020-06-30 DIAGNOSIS — R059 Cough, unspecified: Secondary | ICD-10-CM | POA: Diagnosis present

## 2020-06-30 NOTE — ED Provider Notes (Signed)
[facility] EMERGENCY DEPARTMENT Provider Note   CSN: [id number] Arrival date & time: 06/30/20  6433     History Chief Complaint  Patient presents with  . Covid Symptoms    Natasha Winters is a 50 y.o. female.  HPI 50 year old female with a history of HIV presents to the ER with complaints of productive cough with green mucus, sore throat, headache x2 days.  States her grandson is sick as well.  Denies any fevers, nausea, vomiting, domino pain, chest pain or shortness of breath.  No dysuria or hematuria. No hemoptysis. She is vaccinated for COVID.  She works as a Special educational needs teacher.  She has been compliant with her HIV medicines.    Past Medical History:  Diagnosis Date  . HIV (human immunodeficiency virus infection) (HCC)     There are no problems to display for this patient.   Past Surgical History:  Procedure Laterality Date  . APPENDECTOMY       OB History   No obstetric history on file.     History reviewed. No pertinent family history.  Social History   Tobacco Use  . Smoking status: Current Some Day Smoker  . Smokeless tobacco: Never Used  Vaping Use  . Vaping Use: Never used  Substance Use Topics  . Alcohol use: Not Currently    Comment: occasionally  . Drug use: No    Home Medications Prior to Admission medications   Medication Sig Start Date End Date Taking? Authorizing Provider  benzonatate (TESSALON) 100 MG capsule Take 1 capsule (100 mg total) by mouth every 8 (eight) hours. 05/02/20   Khatri, Hina, PA-C  conjugated estrogens (PREMARIN) vaginal cream Place vaginally. 03/17/20   [provider]  DULoxetine (CYMBALTA) 30 MG capsule Take 1 tablet by mouth daily. 04/10/20   [provider]  elvitegravir-cobicistat-emtricitabine-tenofovir (GENVOYA) 150-150-200-10 MG TABS tablet Take by mouth. 01/31/17   [provider]  ibuprofen (ADVIL) 800 MG tablet Take by mouth. 07/20/16   [provider]  metFORMIN  (GLUCOPHAGE) 500 MG tablet Take by mouth. 03/17/20   [provider]  ondansetron (ZOFRAN) 4 MG tablet Take 1 tablet (4 mg total) by mouth every 8 (eight) hours as needed for nausea or vomiting. 10/06/19   Caccavale, Sophia, PA-C    Allergies    Doxycycline  Review of Systems   Review of Systems  Constitutional: Positive for activity change, appetite change, chills, fatigue and fever.  Respiratory: Positive for cough. Negative for shortness of breath.   Cardiovascular: Negative for chest pain.  Gastrointestinal: Positive for diarrhea, nausea and vomiting. Negative for abdominal pain.  Genitourinary: Negative for dysuria.  Neurological: Positive for weakness.    Physical Exam Updated Vital Signs BP (!) 127/100 (BP Location: Left Arm)   Pulse 95   Temp 98.5 F (36.9 C) (Oral)   Resp 18   Ht 5\' 7"  (1.702 m)   Wt 123.4 kg   LMP 11/22/2013 Comment: not preg, not sexually active  SpO2 97%   BMI 42.60 kg/m   Physical Exam Vitals and nursing note reviewed.  Constitutional:      General: She is not in acute distress.    Appearance: She is well-developed and well-nourished.  HENT:     Head: Normocephalic and atraumatic.     Mouth/Throat:     Mouth: Mucous membranes are moist.     Comments: Oropharynx non erythematous without exudates, uvula midline, no unilateral tonsillar swelling, tongue normal size and midline, no sublingual/submandibular swellimg, tolerating  secretions well    Eyes:     Conjunctiva/sclera: Conjunctivae normal.  Cardiovascular:     Rate and Rhythm: Normal rate and regular rhythm.     Pulses: Normal pulses.     Heart sounds: No murmur heard.   Pulmonary:     Effort: Pulmonary effort is normal. No respiratory distress.     Breath sounds: Normal breath sounds.  Abdominal:     General: Abdomen is flat.     Palpations: Abdomen is soft.     Tenderness: There is no abdominal tenderness.  Musculoskeletal:        General: No edema.     Cervical  back: Normal range of motion and neck supple.  Skin:    General: Skin is warm and dry.  Neurological:     General: No focal deficit present.     Mental Status: She is alert and oriented to person, place, and time.  Psychiatric:        Mood and Affect: Mood and affect and mood normal.        Behavior: Behavior normal.     ED Results / Procedures / Treatments   Labs (all labs ordered are listed, but only abnormal results are displayed) Labs Reviewed  SARS CORONAVIRUS 2 (TAT 6-24 HRS)    EKG None  Radiology No results found.  Procedures Procedures (including critical care time)  Medications Ordered in ED Medications - No data to display  ED Course  I have reviewed the triage vital signs and the nursing notes.  Pertinent labs & imaging results that were available during my care of the patient were reviewed by me and considered in my medical decision making (see chart for details).    MDM Rules/Calculators/A&P                         Patients symptoms are consistent with URI, likely viral etiology. Consider COVID-19, COVID test sent. Pt with known exposure. Patient is  vaccinated against COVID-19. Oropharynx non erythematous, without exudates, uvula midline. Low suspicion of RPA/PTA, Ludwig's angina.  Patient is afebrile, tolerating secretions.  Lungs clear to auscultation bilaterally. Normal O2 saturation.  Pt will be discharged with symptomatic treatment, home isolation precautions.  Return precautions discussed. Verbalizes understanding and is agreeable with plan. Pt is hemodynamically stable & in NAD prior to dc.   Final Clinical Impression(s) / ED Diagnoses Final diagnoses:  Upper respiratory tract infection, unspecified type  Encounter for laboratory testing for COVID-19 virus    Rx / DC Orders ED Discharge Orders    None       Leone Brand 06/30/20 1035    Cheryll Cockayne, MD 07/05/20 [phone]

## 2020-06-30 NOTE — ED Triage Notes (Signed)
Reports headache, cough, sore throat, and body aches since yesterday.

## 2020-06-30 NOTE — Discharge Instructions (Signed)
You were tested for COVID-19 today, make sure to quarantine until you receive the results. You may follow up on these results via the MyChart App. There are instructions on how to download this on your discharge paperwork. If your test is positive, please make sure to quarantine additional 7 to 10 days. You may take ibuprofen/Tylenol for body aches and fevers, drink plenty of fluids, over-the-counter cold and flu medications. You may also buy an over-the-counter pulse oximeter which can be found at your local pharmacy. If your oxygen saturation drops below 90%, please make sure to return to the ER. Return to the ER for any worsening shortness of breath.

## 2020-07-01 LAB — SARS CORONAVIRUS 2 (TAT 6-24 HRS): SARS Coronavirus 2: POSITIVE — AB

## 2020-07-02 ENCOUNTER — Telehealth: Payer: Self-pay | Admitting: Unknown Physician Specialty

## 2020-07-02 NOTE — Telephone Encounter (Signed)
Called to discuss with patient about COVID-19 symptoms and the use of one of the available treatments for those with mild to moderate Covid symptoms and at a high risk of hospitalization.  Pt appears to qualify for outpatient treatment due to co-morbid conditions and/or a member of an at-risk group in accordance with the FDA Emergency Use Authorization.    Symptom onset: 1/13 Unable to reach pt or leave a message  Natasha Winters

## 2020-07-04 ENCOUNTER — Ambulatory Visit: Payer: Self-pay

## 2020-07-04 NOTE — Telephone Encounter (Signed)
I have covid-19 dx I have 2 young children 2years and 6 months. They seem fine but runny nose. How can I treat them. Mom was advised that children should drink plenty of fluids and she can give them any OTC medication that she would normally give them. She was encouraged to reach out to their pediatrician office for advice on what she can give them . She was told that if they became congested or inactive she should reach out immediately to HCP .  She verbalized understanding. Per chart an attempt to reach patient for treatments for her COVID-19 was done 1/16 22.  She was given number to call for information.  She verbalized understanding of all information   Reason for Disposition . General information question, no triage required and triager able to answer question  Answer Assessment - Initial Assessment Questions 1. REASON FOR CALL or QUESTION: "What is your reason for calling today?" or "How can I best help you?" or "What question do you have that I can help answer?"     I have covid-19 dx I have 2 young children 2years and 6 months. They seem fine but runny nose. How can I treat them.  Protocols used: INFORMATION ONLY CALL - NO TRIAGE-A-AH

## 2020-09-06 ENCOUNTER — Other Ambulatory Visit: Payer: Self-pay

## 2020-09-06 ENCOUNTER — Emergency Department (HOSPITAL_BASED_OUTPATIENT_CLINIC_OR_DEPARTMENT_OTHER)
Admission: EM | Admit: 2020-09-06 | Discharge: 2020-09-06 | Disposition: A | Discharge status: Home / Self Care | Payer: BC Managed Care – PPO | Attending: Emergency Medicine | Admitting: Emergency Medicine

## 2020-09-06 ENCOUNTER — Emergency Department (HOSPITAL_BASED_OUTPATIENT_CLINIC_OR_DEPARTMENT_OTHER): Payer: BC Managed Care – PPO

## 2020-09-06 ENCOUNTER — Encounter (HOSPITAL_BASED_OUTPATIENT_CLINIC_OR_DEPARTMENT_OTHER): Payer: Self-pay | Admitting: Emergency Medicine

## 2020-09-06 DIAGNOSIS — R0789 Other chest pain: Secondary | ICD-10-CM | POA: Diagnosis present

## 2020-09-06 DIAGNOSIS — M545 Low back pain, unspecified: Secondary | ICD-10-CM | POA: Insufficient documentation

## 2020-09-06 DIAGNOSIS — Z7984 Long term (current) use of oral hypoglycemic drugs: Secondary | ICD-10-CM | POA: Insufficient documentation

## 2020-09-06 DIAGNOSIS — Z21 Asymptomatic human immunodeficiency virus [HIV] infection status: Secondary | ICD-10-CM | POA: Insufficient documentation

## 2020-09-06 DIAGNOSIS — F17200 Nicotine dependence, unspecified, uncomplicated: Secondary | ICD-10-CM | POA: Insufficient documentation

## 2020-09-06 DIAGNOSIS — E119 Type 2 diabetes mellitus without complications: Secondary | ICD-10-CM | POA: Insufficient documentation

## 2020-09-06 HISTORY — DX: Type 2 diabetes mellitus without complications: E11.9

## 2020-09-06 LAB — BASIC METABOLIC PANEL
Anion gap: 7 (ref 5–15)
BUN: 13 mg/dL (ref 6–20)
CO2: 28 mmol/L (ref 22–32)
Calcium: 9 mg/dL (ref 8.9–10.3)
Chloride: 104 mmol/L (ref 98–111)
Creatinine, Ser: 0.61 mg/dL (ref 0.44–1.00)
GFR, Estimated: >60 mL/min (ref >60)
Glucose, Bld: 150 mg/dL — ABNORMAL HIGH (ref 70–99)
Potassium: 3.7 mmol/L (ref 3.5–5.1)
Sodium: 139 mmol/L (ref 135–145)

## 2020-09-06 LAB — CBC WITH DIFFERENTIAL/PLATELET
Abs Immature Granulocytes: 0.02 10*3/uL (ref 0.00–0.07)
Basophils Absolute: 0 10*3/uL (ref 0.0–0.1)
Basophils Relative: 1 %
Eosinophils Absolute: 0.4 10*3/uL (ref 0.0–0.5)
Eosinophils Relative: 5 %
HCT: 42.6 % (ref 36.0–46.0)
Hemoglobin: 13.2 g/dL (ref 12.0–15.0)
Immature Granulocytes: 0 %
Lymphocytes Relative: 22 %
Lymphs Abs: 1.6 10*3/uL (ref 0.7–4.0)
MCH: 24.6 pg — ABNORMAL LOW (ref 26.0–34.0)
MCHC: 31 g/dL (ref 30.0–36.0)
MCV: 79.5 fL — ABNORMAL LOW (ref 80.0–100.0)
Monocytes Absolute: 0.4 10*3/uL (ref 0.1–1.0)
Monocytes Relative: 5 %
Neutro Abs: 5 10*3/uL (ref 1.7–7.7)
Neutrophils Relative %: 67 %
Platelets: 288 10*3/uL (ref 150–400)
RBC: 5.36 MIL/uL — ABNORMAL HIGH (ref 3.87–5.11)
RDW: 15.7 % — ABNORMAL HIGH (ref 11.5–15.5)
WBC: 7.4 10*3/uL (ref 4.0–10.5)
nRBC: 0 % (ref 0.0–0.2)

## 2020-09-06 LAB — CBG MONITORING, ED: Glucose-Capillary: 139 mg/dL — ABNORMAL HIGH (ref 70–99)

## 2020-09-06 LAB — TROPONIN I (HIGH SENSITIVITY): Troponin I (High Sensitivity): 4 ng/L (ref <18)

## 2020-09-06 LAB — D-DIMER, QUANTITATIVE: D-Dimer, Quant: <0.27 ug/mL-FEU (ref 0.00–0.50)

## 2020-09-06 MED ORDER — CYCLOBENZAPRINE HCL 10 MG PO TABS: 10.0000 mg | ORAL_TABLET | Freq: Two times a day (BID) | ORAL | 0 refills | Status: DC | PRN

## 2020-09-06 MED ORDER — KETOROLAC TROMETHAMINE 30 MG/ML IJ SOLN: 30.0000 mg | Freq: Once | INTRAMUSCULAR | Status: DC

## 2020-09-06 MED ORDER — KETOROLAC TROMETHAMINE 30 MG/ML IJ SOLN
30.0000 mg | Freq: Once | INTRAMUSCULAR | Status: AC
  Administered 2020-09-06: 30 mg via INTRAVENOUS
  Filled 2020-09-06: qty 1

## 2020-09-06 MED ORDER — ACETAMINOPHEN 325 MG PO TABS
650.0000 mg | ORAL_TABLET | Freq: Once | ORAL | Status: AC
  Administered 2020-09-06: 650 mg via ORAL
  Filled 2020-09-06: qty 2

## 2020-09-06 NOTE — Discharge Instructions (Signed)
Overall I suspect that you have musculoskeletal pain.  Recommend 600 mg of Motrin every 8 hours as needed for pain.  Recommend 1000 mg of Tylenol every 6 hours as needed for pain.  Take muscle relaxant Flexeril as prescribed but do not mix with any alcohol or drugs as this medicine can make you sleepy.  Do not drive while taking this medicine.

## 2020-09-06 NOTE — ED Provider Notes (Signed)
[facility] EMERGENCY DEPARTMENT Provider Note   CSN: [id number] Arrival date & time: 09/06/20  0815     History Chief Complaint  Patient presents with  . Chest Pain    Natasha Winters is a 50 y.o. female.  The history is provided by the patient.  Chest Pain Pain location:  R chest Pain quality: aching   Pain radiates to:  Does not radiate Pain severity:  Mild Onset quality:  Gradual Duration:  2 days Timing:  Intermittent Progression:  Waxing and waning Chronicity:  New Context: movement and at rest   Relieved by:  Nothing Worsened by:  Nothing Associated symptoms: back pain (lower back pain)   Associated symptoms: no abdominal pain, no AICD problem, no altered mental status, no claudication, no cough, no diaphoresis, no dizziness, no dysphagia, no fatigue, no fever, no headache, no heartburn, no numbness, no palpitations, no shortness of breath, no vomiting and no weakness   Risk factors: diabetes mellitus   Risk factors comment:  HIV, recent covid 2 months ago      Past Medical History:  Diagnosis Date  . Diabetes mellitus without complication (HCC)   . HIV (human immunodeficiency virus infection) (HCC)     There are no problems to display for this patient.   Past Surgical History:  Procedure Laterality Date  . APPENDECTOMY       OB History   No obstetric history on file.     No family history on file.  Social History   Tobacco Use  . Smoking status: Current Some Day Smoker  . Smokeless tobacco: Never Used  Vaping Use  . Vaping Use: Never used  Substance Use Topics  . Alcohol use: Not Currently    Comment: occasionally  . Drug use: No    Home Medications Prior to Admission medications   Medication Sig Start Date End Date Taking? Authorizing Provider  cyclobenzaprine (FLEXERIL) 10 MG tablet Take 1 tablet (10 mg total) by mouth 2 (two) times daily as needed for up to 20 doses for muscle spasms. 09/06/20  Yes Eshaal Duby, DO   benzonatate (TESSALON) 100 MG capsule Take 1 capsule (100 mg total) by mouth every 8 (eight) hours. 05/02/20   Khatri, Hina, PA-C  conjugated estrogens (PREMARIN) vaginal cream Place vaginally. 03/17/20   [provider]  DULoxetine (CYMBALTA) 30 MG capsule Take 1 tablet by mouth daily. 04/10/20   [provider]  elvitegravir-cobicistat-emtricitabine-tenofovir (GENVOYA) 150-150-200-10 MG TABS tablet Take by mouth. 01/31/17   [provider]  ibuprofen (ADVIL) 800 MG tablet Take by mouth. 07/20/16   [provider]  metFORMIN (GLUCOPHAGE) 500 MG tablet Take by mouth. 03/17/20   [provider]  ondansetron (ZOFRAN) 4 MG tablet Take 1 tablet (4 mg total) by mouth every 8 (eight) hours as needed for nausea or vomiting. 10/06/19   Caccavale, Sophia, PA-C    Allergies    Doxycycline  Review of Systems   Review of Systems  Constitutional: Negative for chills, diaphoresis, fatigue and fever.  HENT: Negative for ear pain, sore throat and trouble swallowing.   Eyes: Negative for pain and visual disturbance.  Respiratory: Negative for cough and shortness of breath.   Cardiovascular: Positive for chest pain. Negative for palpitations and claudication.  Gastrointestinal: Negative for abdominal pain, heartburn and vomiting.  Genitourinary: Negative for dysuria and hematuria.  Musculoskeletal: Positive for back pain (lower back pain). Negative for arthralgias, gait problem, joint swelling, myalgias, neck pain and neck stiffness.  Skin:  Negative for color change and rash.  Neurological: Negative for dizziness, tremors, seizures, syncope, facial asymmetry, speech difficulty, weakness, light-headedness, numbness and headaches.  All other systems reviewed and are negative.   Physical Exam Updated Vital Signs  ED Triage Vitals  Enc Vitals Group     BP 09/06/20 0825 (!) 112/58     Pulse Rate 09/06/20 0825 76     Resp 09/06/20 0825 20     Temp 09/06/20 0825  98.3 F (36.8 C)     Temp Source 09/06/20 0825 Oral     SpO2 09/06/20 0825 99 %     Weight 09/06/20 0824 272 lb 0.8 oz (123.4 kg)     Height 09/06/20 0824 5\' 7"  (1.702 m)     Head Circumference --      Peak Flow --      Pain Score 09/06/20 0824 8     Pain Loc --      Pain Edu? --      Excl. in GC? --     Physical Exam Vitals and nursing note reviewed.  Constitutional:      General: She is not in acute distress.    Appearance: She is well-developed. She is not ill-appearing.  HENT:     Head: Normocephalic and atraumatic.  Eyes:     Conjunctiva/sclera: Conjunctivae normal.     Pupils: Pupils are equal, round, and reactive to light.  Cardiovascular:     Rate and Rhythm: Normal rate and regular rhythm.     Pulses:          Radial pulses are 2+ on the right side and 2+ on the left side.     Heart sounds: Normal heart sounds. No murmur heard.   Pulmonary:     Effort: Pulmonary effort is normal. No respiratory distress.     Breath sounds: Normal breath sounds. No decreased breath sounds, wheezing or rhonchi.  Chest:     Chest wall: Tenderness (right side of chest wall) present.  Abdominal:     Palpations: Abdomen is soft.     Tenderness: There is no abdominal tenderness.  Musculoskeletal:        General: Normal range of motion.     Cervical back: Normal range of motion and neck supple.     Comments: TTP in paraspinal lumbar muscles   Skin:    General: Skin is warm and dry.     Capillary Refill: Capillary refill takes less than 2 seconds.  Neurological:     General: No focal deficit present.     Mental Status: She is alert and oriented to person, place, and time.     Cranial Nerves: No cranial nerve deficit.     Motor: No weakness.     Comments: 5+ out of 5 strength throughout, normal sensation, no drift, normal finger-to-nose finger     ED Results / Procedures / Treatments   Labs (all labs ordered are listed, but only abnormal results are displayed) Labs Reviewed   CBC WITH DIFFERENTIAL/PLATELET - Abnormal; Notable for the following components:      Result Value   RBC 5.36 (*)    MCV 79.5 (*)    MCH 24.6 (*)    RDW 15.7 (*)    All other components within normal limits  BASIC METABOLIC PANEL - Abnormal; Notable for the following components:   Glucose, Bld 150 (*)    All other components within normal limits  CBG MONITORING, ED - Abnormal; Notable for the following  components:   Glucose-Capillary 139 (*)    All other components within normal limits  D-DIMER, QUANTITATIVE  CBG MONITORING, ED  TROPONIN I (HIGH SENSITIVITY)    EKG EKG Interpretation  Date/Time:  Wednesday September 06 2020 08:27:02 EDT Ventricular Rate:  76 PR Interval:    QRS Duration: 96 QT Interval:  395 QTC Calculation: 445 R Axis:   68 Text Interpretation: Sinus rhythm Confirmed by Virgina Norfolk [phone]) on 09/06/2020 8:38:30 AM   Radiology DG Chest Portable 1 View  Result Date: 09/06/2020 CLINICAL DATA:  Chest pain EXAM: PORTABLE CHEST 1 VIEW COMPARISON:  September 10, 2019 FINDINGS: Lungs are clear. Heart is enlarged with pulmonary vascularity normal. No adenopathy. No pneumothorax. No bone lesions. IMPRESSION: Cardiac enlargement.  No edema or airspace opacity. Electronically Signed   By: Bretta Bang III M.D.   On: 09/06/2020 09:09    Procedures Procedures   Medications Ordered in ED Medications  ketorolac (TORADOL) 30 MG/ML injection 30 mg (has no administration in time range)  acetaminophen (TYLENOL) tablet 650 mg (650 mg Oral Given 09/06/20 1607)    ED Course  I have reviewed the triage vital signs and the nursing notes.  Pertinent labs & imaging results that were available during my care of the patient were reviewed by me and considered in my medical decision making (see chart for details).    MDM Rules/Calculators/A&P                          ILIANA HUTT is a 49 year old female who presents to the ED with right-sided chest pain.  Patient has  history of HIV and diabetes.  Some low back pain as well.  Seems to be muscular in nature.  Worse with movement and palpation.  Does work doing some lifting.  Did have recent Covid however and concern for possible PE and will get D-dimer.  Chest pain seems atypical for ACS.  EKG shows sinus rhythm.  No ischemic changes.  Will chec troponin.  Pain has been fairly constant for the last 2 days.  Will check chest x-ray and basic labs otherwise.  Lower suspicion for pneumonia or electrolyte issue.  D-dimer normal and doubt PE.  Troponin normal.  Doubt ACS.  Otherwise no significant electrolyte abnormality, kidney injury.  Chest x-ray without signs of pneumonia or pneumothorax.  Overall suspect musculoskeletal pain.  Given a Toradol shot.  Recommend Tylenol, Motrin, Flexeril.  Understands return precautions and discharged in ED in good condition.  This chart was dictated using voice recognition software.  Despite best efforts to proofread,  errors can occur which can change the documentation meaning.    Final Clinical Impression(s) / ED Diagnoses Final diagnoses:  Atypical chest pain  Acute low back pain, unspecified back pain laterality, unspecified whether sciatica present    Rx / DC Orders ED Discharge Orders         Ordered    cyclobenzaprine (FLEXERIL) 10 MG tablet  2 times daily PRN        09/06/20 1001           Ryley Bachtel, DO 09/06/20 1007

## 2020-09-06 NOTE — ED Triage Notes (Signed)
Reports right sided chest pain described as sharp that started a few days ago.  Also c/o low back pain.  Denies any urinary symptoms.

## 2020-11-30 ENCOUNTER — Other Ambulatory Visit (HOSPITAL_BASED_OUTPATIENT_CLINIC_OR_DEPARTMENT_OTHER): Payer: Self-pay | Admitting: Nurse Practitioner

## 2020-11-30 ENCOUNTER — Ambulatory Visit (HOSPITAL_BASED_OUTPATIENT_CLINIC_OR_DEPARTMENT_OTHER)
Admission: RE | Admit: 2020-11-30 | Discharge: 2020-11-30 | Disposition: A | Discharge status: Home / Self Care | Payer: 59 | Source: Ambulatory Visit | Attending: Nurse Practitioner | Admitting: Nurse Practitioner

## 2020-11-30 ENCOUNTER — Other Ambulatory Visit: Payer: Self-pay

## 2020-11-30 DIAGNOSIS — M25571 Pain in right ankle and joints of right foot: Secondary | ICD-10-CM | POA: Insufficient documentation

## 2021-02-02 ENCOUNTER — Emergency Department (HOSPITAL_BASED_OUTPATIENT_CLINIC_OR_DEPARTMENT_OTHER)
Admission: EM | Admit: 2021-02-02 | Discharge: 2021-02-02 | Disposition: A | Discharge status: Home / Self Care | Payer: 59 | Attending: Emergency Medicine | Admitting: Emergency Medicine

## 2021-02-02 ENCOUNTER — Other Ambulatory Visit: Payer: Self-pay

## 2021-02-02 ENCOUNTER — Encounter (HOSPITAL_BASED_OUTPATIENT_CLINIC_OR_DEPARTMENT_OTHER): Payer: Self-pay | Admitting: *Deleted

## 2021-02-02 DIAGNOSIS — Z5321 Procedure and treatment not carried out due to patient leaving prior to being seen by health care provider: Secondary | ICD-10-CM | POA: Insufficient documentation

## 2021-02-02 DIAGNOSIS — R0789 Other chest pain: Secondary | ICD-10-CM | POA: Insufficient documentation

## 2021-02-02 NOTE — ED Triage Notes (Signed)
Sharp stabbing pain to her right upper chest pain x 2 days. 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 EKG done at triage.

## 2021-02-03 ENCOUNTER — Emergency Department (HOSPITAL_BASED_OUTPATIENT_CLINIC_OR_DEPARTMENT_OTHER): Payer: 59

## 2021-02-03 ENCOUNTER — Emergency Department (HOSPITAL_BASED_OUTPATIENT_CLINIC_OR_DEPARTMENT_OTHER)
Admission: EM | Admit: 2021-02-03 | Discharge: 2021-02-03 | Disposition: A | Discharge status: Home / Self Care | Payer: 59 | Attending: Emergency Medicine | Admitting: Emergency Medicine

## 2021-02-03 ENCOUNTER — Encounter (HOSPITAL_BASED_OUTPATIENT_CLINIC_OR_DEPARTMENT_OTHER): Payer: Self-pay | Admitting: Emergency Medicine

## 2021-02-03 ENCOUNTER — Other Ambulatory Visit: Payer: Self-pay

## 2021-02-03 DIAGNOSIS — F17200 Nicotine dependence, unspecified, uncomplicated: Secondary | ICD-10-CM | POA: Diagnosis not present

## 2021-02-03 DIAGNOSIS — R0789 Other chest pain: Secondary | ICD-10-CM

## 2021-02-03 DIAGNOSIS — E119 Type 2 diabetes mellitus without complications: Secondary | ICD-10-CM | POA: Diagnosis not present

## 2021-02-03 DIAGNOSIS — Z7984 Long term (current) use of oral hypoglycemic drugs: Secondary | ICD-10-CM | POA: Diagnosis not present

## 2021-02-03 DIAGNOSIS — R079 Chest pain, unspecified: Secondary | ICD-10-CM | POA: Diagnosis present

## 2021-02-03 LAB — BASIC METABOLIC PANEL
Anion gap: 6 (ref 5–15)
BUN: 16 mg/dL (ref 6–20)
CO2: 25 mmol/L (ref 22–32)
Calcium: 8.3 mg/dL — ABNORMAL LOW (ref 8.9–10.3)
Chloride: 106 mmol/L (ref 98–111)
Creatinine, Ser: 0.68 mg/dL (ref 0.44–1.00)
GFR, Estimated: >60 mL/min (ref >60)
Glucose, Bld: 97 mg/dL (ref 70–99)
Potassium: 3.8 mmol/L (ref 3.5–5.1)
Sodium: 137 mmol/L (ref 135–145)

## 2021-02-03 LAB — CBC WITH DIFFERENTIAL/PLATELET
Abs Immature Granulocytes: 0.01 10*3/uL (ref 0.00–0.07)
Basophils Absolute: 0.1 10*3/uL (ref 0.0–0.1)
Basophils Relative: 1 %
Eosinophils Absolute: 0.4 10*3/uL (ref 0.0–0.5)
Eosinophils Relative: 6 %
HCT: 40.9 % (ref 36.0–46.0)
Hemoglobin: 12.5 g/dL (ref 12.0–15.0)
Immature Granulocytes: 0 %
Lymphocytes Relative: 27 %
Lymphs Abs: 1.7 10*3/uL (ref 0.7–4.0)
MCH: 24.3 pg — ABNORMAL LOW (ref 26.0–34.0)
MCHC: 30.6 g/dL (ref 30.0–36.0)
MCV: 79.4 fL — ABNORMAL LOW (ref 80.0–100.0)
Monocytes Absolute: 0.5 10*3/uL (ref 0.1–1.0)
Monocytes Relative: 7 %
Neutro Abs: 3.8 10*3/uL (ref 1.7–7.7)
Neutrophils Relative %: 59 %
Platelets: 283 10*3/uL (ref 150–400)
RBC: 5.15 MIL/uL — ABNORMAL HIGH (ref 3.87–5.11)
RDW: 15.7 % — ABNORMAL HIGH (ref 11.5–15.5)
WBC: 6.4 10*3/uL (ref 4.0–10.5)
nRBC: 0 % (ref 0.0–0.2)

## 2021-02-03 LAB — TROPONIN I (HIGH SENSITIVITY): Troponin I (High Sensitivity): 4 ng/L (ref <18)

## 2021-02-03 MED ORDER — KETOROLAC TROMETHAMINE 15 MG/ML IJ SOLN
15.0000 mg | Freq: Once | INTRAMUSCULAR | Status: AC
  Administered 2021-02-03: 15 mg via INTRAVENOUS
  Filled 2021-02-03: qty 1

## 2021-02-03 MED ORDER — CYCLOBENZAPRINE HCL 10 MG PO TABS: 10.0000 mg | ORAL_TABLET | Freq: Two times a day (BID) | ORAL | 0 refills | Status: DC | PRN

## 2021-02-03 MED ORDER — LIDOCAINE 5 % EX PTCH
1.0000 | MEDICATED_PATCH | Freq: Once | CUTANEOUS | Status: DC
  Administered 2021-02-03: 1 via TRANSDERMAL
  Filled 2021-02-03: qty 1

## 2021-02-03 NOTE — Discharge Instructions (Addendum)
-  Prescription for Flexeril sent to the pharmacy.  This is a muscle laxer.  This can make you drowsy so do not take it if you are going to be driving or working.  You should take Tylenol and ibuprofen for pain at home.  Follow-up with your regular doctor for recheck.

## 2021-02-03 NOTE — ED Triage Notes (Signed)
Patient returns to complete visit from 8/19. Had to leave early to pick up grandchildren. No relief from pain after extra strength Tylenol.

## 2021-02-03 NOTE — ED Provider Notes (Signed)
[facility] EMERGENCY DEPARTMENT Provider Note   CSN: [id number] Arrival date & time: 02/03/21  1203     History Chief Complaint  Patient presents with   Chest Pain    Natasha Winters is a 50 y.o. female with past medical history significant for diabetes HIV, and tobacco abuse presenting to emergency room today with chief complaint of intermittent right-sided chest pain x3 days.  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  She states the pain is sharp.  Pain is worse with movement.  She admits to family history of cardiac disease stating her father had a heart attack in his 75s.  She took extra strength Tylenol last night for pain and it improved however returned this morning. She rates pain 6/10 in severity. She admits to history of similar pain and reports it was caused by a pulled muscle. Denies dyspnea on exertion, SOB, chest tightness or pressure, radiation to left/right arm, jaw or back, nausea, or diaphoresis.   Past Medical History:  Diagnosis Date   Diabetes mellitus without complication (HCC)    HIV (human immunodeficiency virus infection) (HCC)     There are no problems to display for this patient.   Past Surgical History:  Procedure Laterality Date   APPENDECTOMY       OB History   No obstetric history on file.     History reviewed. No pertinent family history.  Social History   Tobacco Use   Smoking status: Some Days   Smokeless tobacco: Never  Vaping Use   Vaping Use: Never used  Substance Use Topics   Alcohol use: Not Currently    Comment: occasionally   Drug use: No    Home Medications Prior to Admission medications   Medication Sig Start Date End Date Taking? Authorizing Provider  cyclobenzaprine (FLEXERIL) 10 MG tablet Take 1 tablet (10 mg total) by mouth 2 (two) times daily as needed for muscle spasms. 02/03/21  Yes Walisiewicz, Dusti Tetro E, PA-C  benzonatate (TESSALON) 100 MG capsule Take 1 capsule (100 mg total)  by mouth every 8 (eight) hours. 05/02/20   Khatri, Hina, PA-C  conjugated estrogens (PREMARIN) vaginal cream Place vaginally. 03/17/20   [provider]  DULoxetine (CYMBALTA) 30 MG capsule Take 1 tablet by mouth daily. 04/10/20   [provider]  elvitegravir-cobicistat-emtricitabine-tenofovir (GENVOYA) 150-150-200-10 MG TABS tablet Take by mouth. 01/31/17   [provider]  ibuprofen (ADVIL) 800 MG tablet Take by mouth. 07/20/16   [provider]  metFORMIN (GLUCOPHAGE) 500 MG tablet Take by mouth. 03/17/20   [provider]  ondansetron (ZOFRAN) 4 MG tablet Take 1 tablet (4 mg total) by mouth every 8 (eight) hours as needed for nausea or vomiting. 10/06/19   Caccavale, Sophia, PA-C    Allergies    Doxycycline  Review of Systems   Review of Systems All other systems are reviewed and are negative for acute change except as noted in the HPI.  Physical Exam Updated Vital Signs BP 138/88 (BP Location: Right Arm)   Pulse 70   Temp 98 F (36.7 C) (Oral)   Resp 18   Ht 5\' 7"  (1.702 m)   Wt 121.1 kg   LMP 11/22/2013 Comment: not preg, not sexually active  SpO2 98%   BMI 41.82 kg/m   Physical Exam Vitals and nursing note reviewed.  Constitutional:      General: She is not in acute distress.    Appearance: She is not  ill-appearing.  HENT:     Head: Normocephalic and atraumatic.     Right Ear: Tympanic membrane and external ear normal.     Left Ear: Tympanic membrane and external ear normal.     Nose: Nose normal.     Mouth/Throat:     Mouth: Mucous membranes are moist.     Pharynx: Oropharynx is clear.  Eyes:     General: No scleral icterus.       Right eye: No discharge.        Left eye: No discharge.     Extraocular Movements: Extraocular movements intact.     Conjunctiva/sclera: Conjunctivae normal.     Pupils: Pupils are equal, round, and reactive to light.  Neck:     Vascular: No JVD.  Cardiovascular:     Rate and Rhythm: Normal  rate and regular rhythm.     Pulses: Normal pulses.          Radial pulses are 2+ on the right side and 2+ on the left side.       Dorsalis pedis pulses are 2+ on the right side and 2+ on the left side.     Heart sounds: Normal heart sounds.  Pulmonary:     Comments: Lungs clear to auscultation in all fields. Symmetric chest rise. No wheezing, rales, or rhonchi. Chest:       Comments: Tenderness as depicted in image above. No overlying crepitus. No flail chest or deformity. No overlying skin change.s Abdominal:     Comments: Abdomen is soft, non-distended, and non-tender in all quadrants. No rigidity, no guarding. No peritoneal signs.  Musculoskeletal:        General: Normal range of motion.     Cervical back: Normal range of motion.     Right lower leg: No edema.     Left lower leg: No edema.  Skin:    General: Skin is warm and dry.     Capillary Refill: Capillary refill takes less than 2 seconds.  Neurological:     Mental Status: She is oriented to person, place, and time.     GCS: GCS eye subscore is 4. GCS verbal subscore is 5. GCS motor subscore is 6.     Comments: Fluent speech, no facial droop.  Psychiatric:        Behavior: Behavior normal.    ED Results / Procedures / Treatments   Labs (all labs ordered are listed, but only abnormal results are displayed) Labs Reviewed  CBC WITH DIFFERENTIAL/PLATELET - Abnormal; Notable for the following components:      Result Value   RBC 5.15 (*)    MCV 79.4 (*)    MCH 24.3 (*)    RDW 15.7 (*)    All other components within normal limits  BASIC METABOLIC PANEL - Abnormal; Notable for the following components:   Calcium 8.3 (*)    All other components within normal limits  TROPONIN I (HIGH SENSITIVITY)    EKG EKG Interpretation  Date/Time:  Saturday February 03 2021 12:15:35 EDT Ventricular Rate:  69 PR Interval:  155 QRS Duration: 89 QT Interval:  408 QTC Calculation: 438 R Axis:   68 Text Interpretation: Sinus rhythm  No significant change since last tracing Confirmed by Melene Plan [phone]) on 02/03/2021 1:17:28 PM  Radiology DG Chest 2 View  Result Date: 02/03/2021 CLINICAL DATA:  Chest pain for 2 days EXAM: CHEST - 2 VIEW COMPARISON:  Chest radiograph dated 09/06/2020 FINDINGS: The heart size is enlarged.  Both lungs are clear. The visualized skeletal structures are unremarkable. IMPRESSION: No active pulmonary disease.  Cardiomegaly. Electronically Signed   By: Romona Curls M.D.   On: 02/03/2021 13:34    Procedures Procedures   Medications Ordered in ED Medications  lidocaine (LIDODERM) 5 % 1 patch (1 patch Transdermal Patch Applied 02/03/21 1332)  ketorolac (TORADOL) 15 MG/ML injection 15 mg (15 mg Intravenous Given 02/03/21 1444)    ED Course  I have reviewed the triage vital signs and the nursing notes.  Pertinent labs & imaging results that were available during my care of the patient were reviewed by me and considered in my medical decision making (see chart for details).    MDM Rules/Calculators/A&P                           History provided by patient with additional history obtained from chart review.    Patient presents to the emergency department with chest pain. Patient nontoxic appearing, in no apparent distress, vitals without significant abnormality. Fairly benign physical exam. DDX: ACS, pulmonary embolism, dissection, pneumothorax, effusion, infiltrate, arrhythmia, anemia, electrolyte derangement, MSK. Evaluation initiated with labs, EKG, and CXR. Patient on cardiac monitor.   Work-up in the ER unremarkable. Labs reviewed, no leukocytosis, anemia, or significant electrolyte abnormality. CXR without infiltrate, effusion, pneumothorax, or fracture/dislocation.  Radiologist does comment on cardiomegaly.  I viewed imaging and agree with radiologist impression.  Troponin 4.  Patient given Toradol and Derm patch for pain goal.  Heart score of 3, EKG without obvious ischemia, troponin  negative, doubt ACS.  Patient is asking to be discharged before second troponin drawn because of childcare issues.  I recommended that she stay for delta troponin however she is not able to.  Patient is low risk wells, PERC negative, doubt pulmonary embolism. Pain is not a tearing sensation, symmetric pulses, no widening of mediastinum on CXR, doubt dissection. Cardiac monitor reviewed, no notable arrhythmias or tachycardia. Patient has appeared hemodynamically stable throughout ER visit and appears safe for discharge with close PCP follow up.  She is requesting prescription for Flexeril as this helped with pulled muscle in the past.  Sent to pharmacy.  I discussed results, treatment plan, need for PCP follow-up, and return precautions with the patient. Provided opportunity for questions, patient confirmed understanding and is in agreement with plan.    Portions of this note were generated with Scientist, clinical (histocompatibility and immunogenetics). Dictation errors may occur despite best attempts at proofreading.   Final Clinical Impression(s) / ED Diagnoses Final diagnoses:  Atypical chest pain    Rx / DC Orders ED Discharge Orders          Ordered    cyclobenzaprine (FLEXERIL) 10 MG tablet  2 times daily PRN        02/03/21 1453             Shanon Ace, PA-C 02/03/21 1458    Melene Plan, DO 02/05/21 [phone]

## 2021-02-26 ENCOUNTER — Encounter (HOSPITAL_BASED_OUTPATIENT_CLINIC_OR_DEPARTMENT_OTHER): Payer: Self-pay

## 2021-02-26 ENCOUNTER — Other Ambulatory Visit: Payer: Self-pay

## 2021-02-26 ENCOUNTER — Emergency Department (HOSPITAL_BASED_OUTPATIENT_CLINIC_OR_DEPARTMENT_OTHER)
Admission: EM | Admit: 2021-02-26 | Discharge: 2021-02-26 | Disposition: A | Discharge status: Home / Self Care | Payer: 59 | Attending: Emergency Medicine | Admitting: Emergency Medicine

## 2021-02-26 DIAGNOSIS — Z5321 Procedure and treatment not carried out due to patient leaving prior to being seen by health care provider: Secondary | ICD-10-CM | POA: Diagnosis not present

## 2021-02-26 DIAGNOSIS — M79622 Pain in left upper arm: Secondary | ICD-10-CM | POA: Diagnosis not present

## 2021-02-26 NOTE — ED Triage Notes (Signed)
Pt c/o left arm and thumb pain x 3 weeks. Denies known injury. States feels like her thumb dislocates.

## 2021-05-24 ENCOUNTER — Other Ambulatory Visit: Payer: Self-pay

## 2021-05-24 ENCOUNTER — Encounter (HOSPITAL_BASED_OUTPATIENT_CLINIC_OR_DEPARTMENT_OTHER): Payer: Self-pay

## 2021-05-24 ENCOUNTER — Emergency Department (HOSPITAL_BASED_OUTPATIENT_CLINIC_OR_DEPARTMENT_OTHER): Payer: 59

## 2021-05-24 ENCOUNTER — Emergency Department (HOSPITAL_BASED_OUTPATIENT_CLINIC_OR_DEPARTMENT_OTHER)
Admission: EM | Admit: 2021-05-24 | Discharge: 2021-05-24 | Disposition: A | Discharge status: Home / Self Care | Payer: 59 | Attending: Student | Admitting: Student

## 2021-05-24 DIAGNOSIS — R059 Cough, unspecified: Secondary | ICD-10-CM | POA: Diagnosis present

## 2021-05-24 DIAGNOSIS — E119 Type 2 diabetes mellitus without complications: Secondary | ICD-10-CM | POA: Insufficient documentation

## 2021-05-24 DIAGNOSIS — Z20822 Contact with and (suspected) exposure to covid-19: Secondary | ICD-10-CM | POA: Diagnosis not present

## 2021-05-24 DIAGNOSIS — J101 Influenza due to other identified influenza virus with other respiratory manifestations: Secondary | ICD-10-CM | POA: Diagnosis not present

## 2021-05-24 DIAGNOSIS — F17200 Nicotine dependence, unspecified, uncomplicated: Secondary | ICD-10-CM | POA: Diagnosis not present

## 2021-05-24 DIAGNOSIS — Z21 Asymptomatic human immunodeficiency virus [HIV] infection status: Secondary | ICD-10-CM | POA: Insufficient documentation

## 2021-05-24 DIAGNOSIS — I517 Cardiomegaly: Secondary | ICD-10-CM | POA: Insufficient documentation

## 2021-05-24 LAB — RESP PANEL BY RT-PCR (FLU A&B, COVID) ARPGX2
Influenza A by PCR: POSITIVE — AB
Influenza B by PCR: NEGATIVE
SARS Coronavirus 2 by RT PCR: NEGATIVE

## 2021-05-24 MED ORDER — BENZONATATE 100 MG PO CAPS: 100.0000 mg | ORAL_CAPSULE | Freq: Three times a day (TID) | ORAL | 0 refills | Status: DC

## 2021-05-24 NOTE — ED Provider Notes (Signed)
[place] EMERGENCY DEPARTMENT Provider Note   CSN: [id number] Arrival date & time: 05/24/21  [id number]     History Chief Complaint  Patient presents with   Cough    Natasha Winters is a 50 y.o. female who presents emergency department for evaluation of cough.  Patient has a history of HIV but is on HAART therapy and compliant, undetectable.  She arrives with her 2 grandchildren who also have similar complaints.  Symptoms present for last 3 days.  Denies fever, chest pain, abdominal pain, nausea, vomiting, leg swelling or other systemic symptoms.   Cough Associated symptoms: no chest pain, no chills, no ear pain, no fever, no rash, no shortness of breath and no sore throat       Past Medical History:  Diagnosis Date   Diabetes mellitus without complication ([place])    HIV (human immunodeficiency virus infection) ([place])     There are no problems to display for this patient.   Past Surgical History:  Procedure Laterality Date   APPENDECTOMY       OB History   No obstetric history on file.     History reviewed. No pertinent family history.  Social History   Tobacco Use   Smoking status: Some Days   Smokeless tobacco: Never  Vaping Use   Vaping Use: Never used  Substance Use Topics   Alcohol use: Yes    Comment: occasionally   Drug use: No    Home Medications Prior to Admission medications   Medication Sig Start Date End Date Taking? Authorizing Provider  benzonatate (TESSALON) 100 MG capsule Take 1 capsule (100 mg total) by mouth every 8 (eight) hours. 05/24/21   Markiya Keefe, MD  conjugated estrogens (PREMARIN) vaginal cream Place vaginally. 03/17/20   [provider]  cyclobenzaprine (FLEXERIL) 10 MG tablet Take 1 tablet (10 mg total) by mouth 2 (two) times daily as needed for muscle spasms. 02/03/21   Walisiewicz, Verline Lema E, PA-C  DULoxetine (CYMBALTA) 30 MG capsule Take 1 tablet by mouth daily. 04/10/20   [provider]   elvitegravir-cobicistat-emtricitabine-tenofovir (GENVOYA) 150-150-200-10 MG TABS tablet Take by mouth. 01/31/17   [provider]  ibuprofen (ADVIL) 800 MG tablet Take by mouth. 07/20/16   [provider]  metFORMIN (GLUCOPHAGE) 500 MG tablet Take by mouth. 03/17/20   [provider]  ondansetron (ZOFRAN) 4 MG tablet Take 1 tablet (4 mg total) by mouth every 8 (eight) hours as needed for nausea or vomiting. 10/06/19   Caccavale, Sophia, PA-C    Allergies    Doxycycline  Review of Systems   Review of Systems  Constitutional:  Negative for chills and fever.  HENT:  Negative for ear pain and sore throat.   Eyes:  Negative for pain and visual disturbance.  Respiratory:  Positive for cough. Negative for shortness of breath.   Cardiovascular:  Negative for chest pain and palpitations.  Gastrointestinal:  Negative for abdominal pain and vomiting.  Genitourinary:  Negative for dysuria and hematuria.  Musculoskeletal:  Negative for arthralgias and back pain.  Skin:  Negative for color change and rash.  Neurological:  Negative for seizures and syncope.  All other systems reviewed and are negative.  Physical Exam Updated Vital Signs BP (!) 153/99 (BP Location: Right Arm)   Pulse 80   Temp 98.2 F (36.8 C) (Oral)   Resp (!) 22   Ht 5\' 7"  (1.702 m)   Wt 117 kg   LMP 11/22/2013 Comment: not preg, not sexually  active  SpO2 96%   BMI 40.41 kg/m   Physical Exam Vitals and nursing note reviewed.  Constitutional:      General: She is not in acute distress.    Appearance: She is well-developed.  HENT:     Head: Normocephalic and atraumatic.  Eyes:     Conjunctiva/sclera: Conjunctivae normal.  Cardiovascular:     Rate and Rhythm: Normal rate and regular rhythm.     Heart sounds: No murmur heard. Pulmonary:     Effort: Pulmonary effort is normal. No respiratory distress.     Breath sounds: Normal breath sounds.  Abdominal:     Palpations: Abdomen is soft.      Tenderness: There is no abdominal tenderness.  Musculoskeletal:        General: No swelling.     Cervical back: Neck supple.  Skin:    General: Skin is warm and dry.     Capillary Refill: Capillary refill takes less than 2 seconds.  Neurological:     Mental Status: She is alert.  Psychiatric:        Mood and Affect: Mood normal.    ED Results / Procedures / Treatments   Labs (all labs ordered are listed, but only abnormal results are displayed) Labs Reviewed  RESP PANEL BY RT-PCR (FLU A&B, COVID) ARPGX2 - Abnormal; Notable for the following components:      Result Value   Influenza A by PCR POSITIVE (*)    All other components within normal limits    EKG None  Radiology DG Chest Portable 1 View  Result Date: 05/24/2021 CLINICAL DATA:  Chest pain, cough EXAM: PORTABLE CHEST 1 VIEW COMPARISON:  02/03/2021 FINDINGS: Transverse diameter of heart is increased. There are no signs of pulmonary edema or new focal infiltrates. There is no pleural effusion or pneumothorax. IMPRESSION: Cardiomegaly. There are no signs of pulmonary edema or focal pulmonary consolidation. Electronically Signed   By: Ernie Avena M.D.   On: 05/24/2021 10:10    Procedures Procedures   Medications Ordered in ED Medications - No data to display  ED Course  I have reviewed the triage vital signs and the nursing notes.  Pertinent labs & imaging results that were available during my care of the patient were reviewed by me and considered in my medical decision making (see chart for details).    MDM Rules/Calculators/A&P                           Patient seen emergency department for evaluation of cough.  Physical exam largely unremarkable.  Patient influenza positive today similar to her grandchildren.  Chest x-ray with cardiomegaly but she has no evidence of fluid overload at this time.  Patient outside the window for Tamiflu.  She was given Jerilynn Som and instructed to follow-up with her  primary care physician about her new cardiomegaly finding. Final Clinical Impression(s) / ED Diagnoses Final diagnoses:  Influenza A  Cardiomegaly    Rx / DC Orders ED Discharge Orders          Ordered    benzonatate (TESSALON) 100 MG capsule  Every 8 hours        05/24/21 1107             Mykiah Schmuck, Wyn Forster, MD 05/24/21 1558

## 2021-05-24 NOTE — Discharge Instructions (Signed)
You are seen the emergency department for evaluation of cough and fever.  You are currently positive for influenza A today and this is the cause of your symptoms.  You  will need to stay off of work for 5 days until your symptoms disappear.  Your chest x-ray also incidentally showed cardiomegaly and this is a finding that needs to be discussed with your primary care doctor.  Please call your primary care doctor to discuss this chest x-ray finding.  There is no evidence of shortness of breath or leg swelling today and I do not believe you are in heart failure, but this should be investigated.  Return the emergency department if you have persistent vomiting, shortness of breath, chest pain or any other concerning symptoms.

## 2021-05-24 NOTE — ED Triage Notes (Signed)
Pt c/o cough, runny nose, sore throat, chest congestion for the past few days. Denies fever. Grandchildren have similar symptoms.

## 2021-07-02 ENCOUNTER — Emergency Department (HOSPITAL_BASED_OUTPATIENT_CLINIC_OR_DEPARTMENT_OTHER)
Admission: EM | Admit: 2021-07-02 | Discharge: 2021-07-02 | Disposition: A | Discharge status: Home / Self Care | Payer: No Typology Code available for payment source | Attending: Emergency Medicine | Admitting: Emergency Medicine

## 2021-07-02 ENCOUNTER — Other Ambulatory Visit: Payer: Self-pay

## 2021-07-02 ENCOUNTER — Encounter (HOSPITAL_BASED_OUTPATIENT_CLINIC_OR_DEPARTMENT_OTHER): Payer: Self-pay | Admitting: *Deleted

## 2021-07-02 DIAGNOSIS — R300 Dysuria: Secondary | ICD-10-CM | POA: Insufficient documentation

## 2021-07-02 DIAGNOSIS — Z21 Asymptomatic human immunodeficiency virus [HIV] infection status: Secondary | ICD-10-CM | POA: Diagnosis not present

## 2021-07-02 DIAGNOSIS — R350 Frequency of micturition: Secondary | ICD-10-CM | POA: Diagnosis not present

## 2021-07-02 LAB — URINALYSIS, ROUTINE W REFLEX MICROSCOPIC
Bilirubin Urine: NEGATIVE
Glucose, UA: NEGATIVE mg/dL
Hgb urine dipstick: NEGATIVE
Ketones, ur: NEGATIVE mg/dL
Leukocytes,Ua: NEGATIVE
Nitrite: NEGATIVE
Protein, ur: NEGATIVE mg/dL
Specific Gravity, Urine: >=1.03 (ref 1.005–1.030)
pH: 5.5 (ref 5.0–8.0)

## 2021-07-02 LAB — CBG MONITORING, ED: Glucose-Capillary: 124 mg/dL — ABNORMAL HIGH (ref 70–99)

## 2021-07-02 LAB — PREGNANCY, URINE: Preg Test, Ur: NEGATIVE

## 2021-07-02 NOTE — ED Provider Notes (Signed)
[facility] EMERGENCY DEPARTMENT Provider Note   CSN: [id number] Arrival date & time: 07/02/21  1509     History  Chief Complaint  Patient presents with   Dysuria    Natasha Winters is a 51 y.o. female with a PMH significant for HIV, atrophic vaginitis who presents with frequency, and dysuria for the last 3 days.  Patient denies any abdominal pain, fever, nausea, vomiting.  Patient denies any vaginal discharge, vaginal bleeding, pain with sex.  Patient denies history of pyelonephritis, nephrolithiasis.  She does not have any numbness or tingling in the groin.  Patient reports that she does also have diabetes, has not checked her sugar recently, but has been under good control in her recent memory.   Dysuria     Home Medications Prior to Admission medications   Medication Sig Start Date End Date Taking? Authorizing Provider  benzonatate (TESSALON) 100 MG capsule Take 1 capsule (100 mg total) by mouth every 8 (eight) hours. 05/24/21   Kommor, Madison, MD  conjugated estrogens (PREMARIN) vaginal cream Place vaginally. 03/17/20   [provider]  cyclobenzaprine (FLEXERIL) 10 MG tablet Take 1 tablet (10 mg total) by mouth 2 (two) times daily as needed for muscle spasms. 02/03/21   Walisiewicz, Yvonna Alanis E, PA-C  DULoxetine (CYMBALTA) 30 MG capsule Take 1 tablet by mouth daily. 04/10/20   [provider]  elvitegravir-cobicistat-emtricitabine-tenofovir (GENVOYA) 150-150-200-10 MG TABS tablet Take by mouth. 01/31/17   [provider]  ibuprofen (ADVIL) 800 MG tablet Take by mouth. 07/20/16   [provider]  metFORMIN (GLUCOPHAGE) 500 MG tablet Take by mouth. 03/17/20   [provider]  ondansetron (ZOFRAN) 4 MG tablet Take 1 tablet (4 mg total) by mouth every 8 (eight) hours as needed for nausea or vomiting. 10/06/19   Caccavale, Sophia, PA-C      Allergies    Doxycycline    Review of Systems   Review of Systems  Genitourinary:  Positive  for dysuria and frequency.  All other systems reviewed and are negative.  Physical Exam Updated Vital Signs BP 135/83 (BP Location: Right Arm)    Pulse 72    Temp 98.2 F (36.8 C) (Oral)    Resp 16    Ht 5\' 7"  (1.702 m)    Wt 117.9 kg    LMP 11/22/2013 Comment: not preg, not sexually active   SpO2 98%    BMI 40.72 kg/m  Physical Exam Vitals and nursing note reviewed.  Constitutional:      General: She is not in acute distress.    Appearance: Normal appearance.  HENT:     Head: Normocephalic and atraumatic.  Eyes:     General:        Right eye: No discharge.        Left eye: No discharge.  Cardiovascular:     Rate and Rhythm: Normal rate and regular rhythm.  Pulmonary:     Effort: Pulmonary effort is normal. No respiratory distress.  Abdominal:     Comments: No significant suprapubic tenderness to palpation, no rebound, rigidity, guarding throughout abdomen.  Normal bowel sounds throughout.  Genitourinary:    Comments: Patient declines pelvic exam at this time. Musculoskeletal:        General: No deformity.  Skin:    General: Skin is warm and dry.  Neurological:     Mental Status: She is alert and oriented to person, place, and time.  Psychiatric:        Mood and  Affect: Mood normal.        Behavior: Behavior normal.    ED Results / Procedures / Treatments   Labs (all labs ordered are listed, but only abnormal results are displayed) Labs Reviewed  CBG MONITORING, ED - Abnormal; Notable for the following components:      Result Value   Glucose-Capillary 124 (*)    All other components within normal limits  URINE CULTURE  URINALYSIS, ROUTINE W REFLEX MICROSCOPIC  PREGNANCY, URINE    EKG None  Radiology No results found.  Procedures Procedures    Medications Ordered in ED Medications - No data to display  ED Course/ Medical Decision Making/ A&P                           Medical Decision Making  This is an overall well-appearing patient with history of  HIV, diabetes who presents with dysuria x3 days.  She does not have any complicating symptoms of fever, nausea, vomiting, abdominal pain, hematuria, vaginal discharge.  She does have a history of some vaginal dryness, has been prescribed Premarin estrogen cream in the past.  My physical exam is unremarkable.  Discussed that it may be worthwhile to do a pelvic exam to evaluate for vaginal dryness, irritation, other lesions.  Patient declines at this time.  My differential diagnosis includes UTI versus interstitial cystitis versus molluscum or other lesion near the urethra versus pyelonephritis versus nephrolithiasis.  This is not an exhaustive differential  Personally ordered and reviewed lab work which significant for urinalysis that is without abnormality.  Her CBG is 124.  Her urine pregnancy test is negative.  Discussed with patient in context of normal urine, normal CBG my differential diagnosis includes vaginal lesion versus vaginal dryness versus interstitial cystitis.  Discussed and encouraged increased fluid intake, ibuprofen, Tylenol for pain, and follow-up with PCP, urology.  Patient understands and agrees to plan, discharged in stable condition at this time. Final Clinical Impression(s) / ED Diagnoses Final diagnoses:  Dysuria    Rx / DC Orders ED Discharge Orders     None         Dorien Chihuahua 07/02/21 1623    Lucrezia Starch, MD 07/02/21 1630

## 2021-07-02 NOTE — Discharge Instructions (Addendum)
As we discussed sometimes vaginal dryness, irritation can contribute to dysuria versus occasionally there is a condition known as interstitial cystitis that can cause the symptoms of a urinary tract infection without any bacterial infection.  I recommend that you follow-up with your primary care doctor, or schedule an appointment with urology if the symptoms persist.  Please continue to drink plenty of fluids, you can use your estrogen vaginal cream that you have been prescribed in the past. I have attached some information about interstitial cystitis if you would like to read more.

## 2021-07-02 NOTE — ED Triage Notes (Signed)
C/o freq painful urination x 3 days 

## 2021-07-03 LAB — URINE CULTURE: Culture: <10000 — AB

## 2021-08-28 ENCOUNTER — Encounter (HOSPITAL_BASED_OUTPATIENT_CLINIC_OR_DEPARTMENT_OTHER): Payer: Self-pay | Admitting: Urology

## 2021-08-28 ENCOUNTER — Emergency Department (HOSPITAL_BASED_OUTPATIENT_CLINIC_OR_DEPARTMENT_OTHER)
Admission: EM | Admit: 2021-08-28 | Discharge: 2021-08-28 | Disposition: A | Discharge status: Home / Self Care | Payer: 59 | Attending: Emergency Medicine | Admitting: Emergency Medicine

## 2021-08-28 ENCOUNTER — Other Ambulatory Visit: Payer: Self-pay

## 2021-08-28 DIAGNOSIS — Z23 Encounter for immunization: Secondary | ICD-10-CM | POA: Insufficient documentation

## 2021-08-28 DIAGNOSIS — Z711 Person with feared health complaint in whom no diagnosis is made: Secondary | ICD-10-CM

## 2021-08-28 DIAGNOSIS — B9689 Other specified bacterial agents as the cause of diseases classified elsewhere: Secondary | ICD-10-CM

## 2021-08-28 DIAGNOSIS — Z7984 Long term (current) use of oral hypoglycemic drugs: Secondary | ICD-10-CM | POA: Insufficient documentation

## 2021-08-28 DIAGNOSIS — R300 Dysuria: Secondary | ICD-10-CM | POA: Diagnosis present

## 2021-08-28 DIAGNOSIS — N760 Acute vaginitis: Secondary | ICD-10-CM | POA: Diagnosis not present

## 2021-08-28 DIAGNOSIS — E119 Type 2 diabetes mellitus without complications: Secondary | ICD-10-CM | POA: Insufficient documentation

## 2021-08-28 DIAGNOSIS — Z202 Contact with and (suspected) exposure to infections with a predominantly sexual mode of transmission: Secondary | ICD-10-CM | POA: Diagnosis not present

## 2021-08-28 LAB — URINALYSIS, ROUTINE W REFLEX MICROSCOPIC
Bilirubin Urine: NEGATIVE
Glucose, UA: NEGATIVE mg/dL
Hgb urine dipstick: NEGATIVE
Ketones, ur: NEGATIVE mg/dL
Leukocytes,Ua: NEGATIVE
Nitrite: NEGATIVE
Protein, ur: NEGATIVE mg/dL
Specific Gravity, Urine: >=1.03 (ref 1.005–1.030)
pH: 6 (ref 5.0–8.0)

## 2021-08-28 LAB — WET PREP, GENITAL
Sperm: NONE SEEN
Trich, Wet Prep: NONE SEEN
WBC, Wet Prep HPF POC: >=10 — AB (ref <10)
Yeast Wet Prep HPF POC: NONE SEEN

## 2021-08-28 MED ORDER — CEFTRIAXONE SODIUM 1 G IJ SOLR
1.0000 g | Freq: Once | INTRAMUSCULAR | Status: AC
  Administered 2021-08-28: 1 g via INTRAMUSCULAR
  Filled 2021-08-28: qty 10

## 2021-08-28 MED ORDER — STERILE WATER FOR INJECTION IJ SOLN
INTRAMUSCULAR | Status: AC
  Administered 2021-08-28: 2.1 mL
  Filled 2021-08-28: qty 10

## 2021-08-28 MED ORDER — AZITHROMYCIN 500 MG PO TABS: 1000.0000 mg | ORAL_TABLET | Freq: Every day | ORAL | 0 refills | Status: AC

## 2021-08-28 MED ORDER — METRONIDAZOLE 500 MG PO TABS: 500.0000 mg | ORAL_TABLET | Freq: Two times a day (BID) | ORAL | 0 refills | Status: DC

## 2021-08-28 MED ORDER — LIDOCAINE HCL (PF) 1 % IJ SOLN
2.0000 mL | Freq: Once | INTRAMUSCULAR | Status: DC
Start: 2021-08-28 — End: 2021-08-28

## 2021-08-28 NOTE — Discharge Instructions (Addendum)
As we discussed, testing revealed that you have bacterial vaginosis. I have given you a prescription for an antibiotic to take for management of this.  Please do not drink alcohol while taking this medication.  ? ?He also had many white blood cells present on your exam, therefore I have given you an antibiotic for treatment of gonorrhea and chlamydia as this is the most common cause of this.  This does not result for 24 hours, therefore please monitor your MyChart for results of this.  If it is positive, you will need to inform all sexual partners of your positive test result and abstain from sexual intercourse until you are able to obtain a test of cure from your OB/GYN.  ? ?Return if development of any new or worsening symptoms. ?

## 2021-08-28 NOTE — ED Triage Notes (Signed)
Frequency and urgency with urination  x 1 month , finished nitrofurantoin x 10 day but no relief  ?Denies burning  ?States itching  ?Denies vaginal discharge  ? ?

## 2021-08-28 NOTE — ED Provider Notes (Signed)
?[facility] EMERGENCY DEPARTMENT ?Provider Note ? ? ?CSN: [id number] ?Arrival date & time: 08/28/21  2037 ? ?  ? ?History ? ?Chief Complaint  ?Patient presents with  ? Dysuria  ? ? ?Natasha Winters is a 51 y.o. female. ? ?Patient with history of HIV, diabetes, and atrophic vaginitis presents today with complaints of vaginal itching and pain. She states that same has been ongoing for over the past month without worsening or change. Initially thought that she had a UTI given she was also having some urinary frequency, however she went to her PCP and had a normal urine sample at the beginning of February without signs of UTI. Given her duration of symptoms, she was still given a course of macrobid for UTI which she completed without change or any improvement in her symptoms. She denies burning with urination or vaginal discharge. Endorses recent sexual intercourse without pain. She is postmenopausal. Endorses regular use of her topical estrogen cream for her atrophic vaginitis. ? ?The history is provided by the patient. No language interpreter was used.  ?Dysuria ?Associated symptoms: no abdominal pain, no fever, no flank pain, no nausea, no vaginal discharge and no vomiting   ? ?  ? ?Home Medications ?Prior to Admission medications   ?Medication Sig Start Date End Date Taking? Authorizing Provider  ?benzonatate (TESSALON) 100 MG capsule Take 1 capsule (100 mg total) by mouth every 8 (eight) hours. 05/24/21   Kommor, Madison, MD  ?conjugated estrogens (PREMARIN) vaginal cream Place vaginally. 03/17/20   [provider]  ?cyclobenzaprine (FLEXERIL) 10 MG tablet Take 1 tablet (10 mg total) by mouth 2 (two) times daily as needed for muscle spasms. 02/03/21   Namon CirriWalisiewicz, Kaitlyn E, PA-C  ?DULoxetine (CYMBALTA) 30 MG capsule Take 1 tablet by mouth daily. 04/10/20   [provider]  ?elvitegravir-cobicistat-emtricitabine-tenofovir (GENVOYA) 150-150-200-10 MG TABS tablet Take by mouth. 01/31/17    [provider]  ?ibuprofen (ADVIL) 800 MG tablet Take by mouth. 07/20/16   [provider]  ?metFORMIN (GLUCOPHAGE) 500 MG tablet Take by mouth. 03/17/20   [provider]  ?ondansetron (ZOFRAN) 4 MG tablet Take 1 tablet (4 mg total) by mouth every 8 (eight) hours as needed for nausea or vomiting. 10/06/19   Caccavale, Sophia, PA-C  ?   ? ?Allergies    ?Doxycycline   ? ?Review of Systems   ?Review of Systems  ?Constitutional:  Negative for chills and fever.  ?Gastrointestinal:  Negative for abdominal pain, diarrhea, nausea and vomiting.  ?Genitourinary:  Negative for decreased urine volume, difficulty urinating, dyspareunia, dysuria, flank pain, genital sores, hematuria, menstrual problem, pelvic pain, urgency, vaginal bleeding, vaginal discharge and vaginal pain.  ?All other systems reviewed and are negative. ? ?Physical Exam ?Updated Vital Signs ?BP 123/83 (BP Location: Right Arm)   Pulse 68   Temp 98.5 ?F (36.9 ?C) (Oral)   Resp 16   Ht 5\' 7"  (1.702 m)   Wt 117.9 kg   LMP 11/22/2013 Comment: not preg, not sexually active  SpO2 99%   BMI 40.71 kg/m?  ?Physical Exam ?Vitals and nursing note reviewed. Exam conducted with a chaperone present.  ?Constitutional:   ?   General: She is not in acute distress. ?   Appearance: Normal appearance. She is obese. She is not ill-appearing, toxic-appearing or diaphoretic.  ?   Comments: Patient resting comfortably in bed in no acute distress  ?HENT:  ?   Head: Normocephalic and atraumatic.  ?Eyes:  ?   Extraocular Movements:  Extraocular movements intact.  ?Cardiovascular:  ?   Rate and Rhythm: Normal rate and regular rhythm.  ?   Heart sounds: Normal heart sounds.  ?Pulmonary:  ?   Effort: Pulmonary effort is normal. No respiratory distress.  ?   Breath sounds: Normal breath sounds.  ?Abdominal:  ?   General: Abdomen is flat.  ?   Palpations: Abdomen is soft.  ?   Tenderness: There is no abdominal tenderness.  ?   Hernia: There is no hernia in the  left inguinal area or right inguinal area.  ?Genitourinary: ?   Exam position: Lithotomy position.  ?   Pubic Area: No rash.   ?   Labia:     ?   Right: No rash, tenderness, lesion or injury.     ?   Left: No rash, tenderness, lesion or injury.   ?   Urethra: No prolapse, urethral pain, urethral swelling or urethral lesion.  ?   Vagina: Normal.  ?   Cervix: Discharge present. No cervical motion tenderness, friability, lesion, erythema or cervical bleeding.  ?   Uterus: Normal.   ?   Adnexa: Right adnexa normal and left adnexa normal.  ?   Comments: Small amount of purulent discharge present in the vagina. No CMT or adnexal tenderness ?Musculoskeletal:     ?   General: Normal range of motion.  ?   Cervical back: Normal range of motion.  ?Lymphadenopathy:  ?   Lower Body: No right inguinal adenopathy. No left inguinal adenopathy.  ?Skin: ?   General: Skin is warm and dry.  ?Neurological:  ?   General: No focal deficit present.  ?   Mental Status: She is alert.  ?Psychiatric:     ?   Mood and Affect: Mood normal.     ?   Behavior: Behavior normal.  ? ? ?ED Results / Procedures / Treatments   ?Labs ?(all labs ordered are listed, but only abnormal results are displayed) ?Labs Reviewed  ?WET PREP, GENITAL - Abnormal; Notable for the following components:  ?    Result Value  ? Clue Cells Wet Prep HPF POC PRESENT (*)   ? WBC, Wet Prep HPF POC >=10 (*)   ? All other components within normal limits  ?URINALYSIS, ROUTINE W REFLEX MICROSCOPIC  ?GC/CHLAMYDIA PROBE AMP ([place]) NOT AT [facility]  ? ? ?EKG ?None ? ?Radiology ?No results found. ? ?Procedures ?Procedures  ? ? ?Medications Ordered in ED ?Medications  ?cefTRIAXone (ROCEPHIN) injection 1 g (1 g Intramuscular Given 08/28/21 2322)  ?sterile water (preservative free) injection (2.1 mLs  Given 08/28/21 2322)  ? ? ?ED Course/ Medical Decision Making/ A&P ?  ?                        ?Medical Decision Making ?Amount and/or Complexity of Data Reviewed ?Labs:  ordered. ? ?Risk ?Prescription drug management. ? ? ?Pt presents with vaginal itching. She is afebrile, non-toxic appearing, and in no acute distress with reassuring vital signs and benign physical exam without pain or tenderness. She is positive for bacterial vaginosis on wet prep. UA noninfectious. Does express some concerns for possible STD.  Pt understands that they have GC/Chlamydia cultures pending and that they will need to inform all sexual partners if results return positive. Pt has been treated prophylactically with azithromycin (doxycycline allergy) and Rocephin due to pts history, pelvic exam, and wet prep with increased WBCs.  Pt not concerning for PID  because hemodynamically stable and no cervical motion tenderness on pelvic exam. Pt has also been treated with Flagyl for Bacterial Vaginosis. Pt has been advised to not drink alcohol while on this medication.  Patient to be discharged with instructions to follow up with OBGYN/PCP. Discussed importance of using protection when sexually active. Patient is understanding and amenable with plan. Educated on red flag symptoms that would prompt immediate return. Discharged in stable condition. ? ?Final Clinical Impression(s) / ED Diagnoses ?Final diagnoses:  ?BV (bacterial vaginosis)  ?Concern about STD in female without diagnosis  ? ? ?Rx / DC Orders ?ED Discharge Orders   ? ?      Ordered  ?  metroNIDAZOLE (FLAGYL) 500 MG tablet  2 times daily       ? 08/28/21 2310  ?  azithromycin (ZITHROMAX) 500 MG tablet  Daily       ? 08/28/21 2310  ? ?  ?  ? ?  ?An After Visit Summary was printed and given to the patient. ? ? ?  ?Silva Bandy, PA-C ?08/30/21 2122 ? ?  ?Vanetta Mulders, MD ?08/31/21 1612 ? ?

## 2021-08-29 LAB — GC/CHLAMYDIA PROBE AMP (~~LOC~~) NOT AT ARMC
Chlamydia: NEGATIVE
Comment: NEGATIVE
Comment: NORMAL
Neisseria Gonorrhea: NEGATIVE

## 2021-09-14 ENCOUNTER — Encounter (HOSPITAL_BASED_OUTPATIENT_CLINIC_OR_DEPARTMENT_OTHER): Payer: Self-pay

## 2021-09-14 ENCOUNTER — Emergency Department (HOSPITAL_BASED_OUTPATIENT_CLINIC_OR_DEPARTMENT_OTHER)
Admission: EM | Admit: 2021-09-14 | Discharge: 2021-09-14 | Disposition: A | Discharge status: Home / Self Care | Payer: 59 | Attending: Emergency Medicine | Admitting: Emergency Medicine

## 2021-09-14 ENCOUNTER — Other Ambulatory Visit: Payer: Self-pay

## 2021-09-14 DIAGNOSIS — Z7984 Long term (current) use of oral hypoglycemic drugs: Secondary | ICD-10-CM | POA: Diagnosis not present

## 2021-09-14 DIAGNOSIS — S3992XA Unspecified injury of lower back, initial encounter: Secondary | ICD-10-CM | POA: Diagnosis present

## 2021-09-14 DIAGNOSIS — S39012A Strain of muscle, fascia and tendon of lower back, initial encounter: Secondary | ICD-10-CM | POA: Diagnosis not present

## 2021-09-14 DIAGNOSIS — Z21 Asymptomatic human immunodeficiency virus [HIV] infection status: Secondary | ICD-10-CM | POA: Diagnosis not present

## 2021-09-14 DIAGNOSIS — E119 Type 2 diabetes mellitus without complications: Secondary | ICD-10-CM | POA: Diagnosis not present

## 2021-09-14 DIAGNOSIS — X58XXXA Exposure to other specified factors, initial encounter: Secondary | ICD-10-CM | POA: Diagnosis not present

## 2021-09-14 DIAGNOSIS — R300 Dysuria: Secondary | ICD-10-CM | POA: Diagnosis not present

## 2021-09-14 LAB — URINALYSIS, ROUTINE W REFLEX MICROSCOPIC
Bilirubin Urine: NEGATIVE
Glucose, UA: NEGATIVE mg/dL
Hgb urine dipstick: NEGATIVE
Ketones, ur: NEGATIVE mg/dL
Leukocytes,Ua: NEGATIVE
Nitrite: NEGATIVE
Protein, ur: NEGATIVE mg/dL
Specific Gravity, Urine: >=1.03 (ref 1.005–1.030)
pH: 6 (ref 5.0–8.0)

## 2021-09-14 MED ORDER — LIDOCAINE 5 % EX PTCH
1.0000 | MEDICATED_PATCH | CUTANEOUS | Status: DC
  Administered 2021-09-14: 1 via TRANSDERMAL
  Filled 2021-09-14: qty 1

## 2021-09-14 MED ORDER — PHENAZOPYRIDINE HCL 200 MG PO TABS: 200.0000 mg | ORAL_TABLET | Freq: Three times a day (TID) | ORAL | 0 refills | Status: DC

## 2021-09-14 MED ORDER — PHENAZOPYRIDINE HCL 200 MG PO TABS: 200.0000 mg | ORAL_TABLET | Freq: Three times a day (TID) | ORAL | 0 refills | Status: AC

## 2021-09-14 MED ORDER — METHOCARBAMOL 500 MG PO TABS: 500.0000 mg | ORAL_TABLET | Freq: Two times a day (BID) | ORAL | 0 refills | Status: AC

## 2021-09-14 MED ORDER — METHOCARBAMOL 500 MG PO TABS: 500.0000 mg | ORAL_TABLET | Freq: Two times a day (BID) | ORAL | 0 refills | Status: DC

## 2021-09-14 NOTE — ED Provider Notes (Signed)
?[facility] EMERGENCY DEPARTMENT ?Provider Note ? ? ?CSN: [id number] ?Arrival date & time: 09/14/21  0910 ? ?  ? ?History ? ?Chief Complaint  ?Patient presents with  ? Back Pain  ? ? ?Natasha Winters is a 51 y.o. female. ? ?51 year old female presents with complaint of low back pain intermittent x2 weeks, worse with trying to lie supine in bed or with bending over.  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  Also reports dysuria, has been treated for UTI in February as well as in March however has persistent dysuria.  Also had a negative gonorrhea chlamydia test, was positive for BV, completed this treatment.  She denies vaginal discharge.  She denies any pelvic pain or changes in bowel habits.  No other complaints or concerns today. ? ? ? ?  ? ?Home Medications ?Prior to Admission medications   ?Medication Sig Start Date End Date Taking? Authorizing Provider  ?benzonatate (TESSALON) 100 MG capsule Take 1 capsule (100 mg total) by mouth every 8 (eight) hours. 05/24/21   Kommor, Madison, MD  ?conjugated estrogens (PREMARIN) vaginal cream Place vaginally. 03/17/20   [provider]  ?DULoxetine (CYMBALTA) 30 MG capsule Take 1 tablet by mouth daily. 04/10/20   [provider]  ?elvitegravir-cobicistat-emtricitabine-tenofovir (GENVOYA) 150-150-200-10 MG TABS tablet Take by mouth. 01/31/17   [provider]  ?ibuprofen (ADVIL) 800 MG tablet Take by mouth. 07/20/16   [provider]  ?metFORMIN (GLUCOPHAGE) 500 MG tablet Take by mouth. 03/17/20   [provider]  ?methocarbamol (ROBAXIN) 500 MG tablet Take 1 tablet (500 mg total) by mouth 2 (two) times daily. 09/14/21   Jeannie Fend, PA-C  ?metroNIDAZOLE (FLAGYL) 500 MG tablet Take 1 tablet (500 mg total) by mouth 2 (two) times daily. 08/28/21   Smoot, Shawn Route, PA-C  ?ondansetron (ZOFRAN) 4 MG  tablet Take 1 tablet (4 mg total) by mouth every 8 (eight) hours as needed for nausea or vomiting. 10/06/19   Caccavale, Sophia, PA-C  ?phenazopyridine (PYRIDIUM) 200 MG tablet Take 1 tablet (200 mg total) by mouth 3 (three) times daily. 09/14/21   Jeannie Fend, PA-C  ?   ? ?Allergies    ?Doxycycline   ? ?Review of Systems   ?Review of Systems ?Negative except as per HPI ?Physical Exam ?Updated Vital Signs ?BP (!) 148/74 (BP Location: Right Arm)   Pulse 78   Temp 98.1 ?F (36.7 ?C) (Oral)   Resp 16   Ht 5\' 7"  (1.702 m)   Wt 119.3 kg   LMP 11/22/2013 Comment: not preg, not sexually active  SpO2 100%   BMI 41.19 kg/m?  ?Physical Exam ?Vitals and nursing note reviewed.  ?Constitutional:   ?   General: She is not in acute distress. ?   Appearance: She is well-developed. She is not diaphoretic.  ?HENT:  ?   Head: Normocephalic and atraumatic.  ?Cardiovascular:  ?   Pulses: Normal pulses.  ?Pulmonary:  ?   Effort: Pulmonary effort is normal.  ?Abdominal:  ?   Palpations: Abdomen is soft.  ?   Tenderness: There is no abdominal tenderness. There is no right CVA tenderness or left CVA tenderness.  ?Musculoskeletal:     ?   General: Tenderness present. No swelling or deformity.  ?   Lumbar back: Tenderness present. No bony tenderness. Negative right straight leg raise test  and negative left straight leg raise test.  ?     Back: ? ?   Right lower leg: No edema.  ?   Left lower leg: No edema.  ?Skin: ?   General: Skin is warm and dry.  ?   Findings: No erythema or rash.  ?Neurological:  ?   Mental Status: She is alert and oriented to person, place, and time.  ?   Sensory: No sensory deficit.  ?   Motor: No weakness.  ?Psychiatric:     ?   Behavior: Behavior normal.  ? ? ?ED Results / Procedures / Treatments   ?Labs ?(all labs ordered are listed, but only abnormal results are displayed) ?Labs Reviewed  ?URINE CULTURE  ?URINALYSIS, ROUTINE W REFLEX MICROSCOPIC  ? ? ?EKG ?None ? ?Radiology ?No results  found. ? ?Procedures ?Procedures  ? ? ?Medications Ordered in ED ?Medications  ?lidocaine (LIDODERM) 5 % 1 patch (1 patch Transdermal Patch Applied 09/14/21 0949)  ? ? ?ED Course/ Medical Decision Making/ A&P ?  ?                        ?Medical Decision Making ? ?This patient presents to the ED for concern of low back pain and dysuria, this involves an extensive number of treatment options, and is a complaint that carries with it a high risk of complications and morbidity.  The differential diagnosis includes UTI, interstitial cystitis, lumbar strain, sciatica  ? ? ?Co morbidities that complicate the patient evaluation ? ?DM, HIV ? ? ?Additional history obtained: ? ?External records from outside source obtained and reviewed including ID clinic visit on 07/20/2021, CD4 normal, given Macrobid x10 days for UTI.  Urgent care visit on 09/12/2021 for UTI, started on Cipro twice daily x7 days, positive for BV, negative for trichomoniasis/gonorrhea/chlamydia. ? ? ?Lab Tests: ? ?I Ordered, and personally interpreted labs.  The pertinent results include:  UA negative for infection, protein. Add on cx for continued symptoms despite clear UA with history of HIV ? ? ?Problem List / ED Course / Critical interventions / Medication management ? ?51 year old female with complaint of low back pain and dysuria as above.  Found have tenderness with patient left and right lumbar paraspinous, no midline tenderness.  Negative straight leg raise.  Normal leg strength and sensation. ?Urinalysis is negative for UTI, will send for culture.  Regarding low back pain, consider musculoskeletal source as this is worse with lying supine as well as with flexion, reproduced with palpation.  We will treat with Robaxin and Lidoderm.  Recommend ibuprofen at home, recheck with PCP.  Given gynecology information for follow-up as well. ?I ordered medication including robaxin, Lidoderm  for low back pain  ?Reevaluation of the patient after these medicines  showed that the patient stayed the same ?I have reviewed the patients home medicines and have made adjustments as needed ? ? ? ? ? ? ? ? ?Final Clinical Impression(s) / ED Diagnoses ?Final diagnoses:  ?Strain of lumbar region, initial encounter  ?Dysuria  ? ? ?Rx / DC Orders ?ED Discharge Orders   ? ?      Ordered  ?  methocarbamol (ROBAXIN) 500 MG tablet  2 times daily,   Status:  Discontinued       ? 09/14/21 1018  ?  phenazopyridine (PYRIDIUM) 200 MG tablet  3 times daily,   Status:  Discontinued       ? 09/14/21 1019  ?  methocarbamol (ROBAXIN)  500 MG tablet  2 times daily       ? 09/14/21 1040  ?  phenazopyridine (PYRIDIUM) 200 MG tablet  3 times daily       ? 09/14/21 1040  ? ?  ?  ? ?  ? ? ?  ?Jeannie Fend, PA-C ?09/14/21 1131 ? ?  ?Franne Forts, DO ?09/16/21 [phone] ? ?

## 2021-09-14 NOTE — Discharge Instructions (Addendum)
Regarding your dysuria- Hydrate, take pyridium as needed as prescribed for painful urination. ?Follow up with your PCP for recheck and further work up.  ? ?Regarding your back pain- suspect musculoskeletal cause of pain.  You can apply the Lidoderm patches as prescribed.  Take Robaxin as needed as prescribed in combination with ibuprofen as directed. ? ?You can make an appointment to see a GYN provider:  ? ?[facility] [employer] [facility]  ?[address]  ?[phone]  ? ?[facility] [employer] [facility]  ?[address]  ?([place]  ? ?[facility] [employer] [facility] [place]  ?[address]  ?[phone]  ? ?[facility] [employer] [facility]  ?[address]  ?[phone]  ? ?[facility] [employer] [facility]  ?[place]  ?[phone]  ? ?If you already have an established OB/GYN provider in the area you can make an appointment with them as well. ? ? ? ?

## 2021-09-14 NOTE — ED Triage Notes (Signed)
Pt states low back pain for past 2 weeks, worse last few days.  Ambulatory with steady gait. 2 weeks ago checked for uti, dx BV, finished medications as prescribed.  ?

## 2021-09-15 LAB — URINE CULTURE: Culture: NO GROWTH

## 2022-03-19 ENCOUNTER — Encounter (HOSPITAL_BASED_OUTPATIENT_CLINIC_OR_DEPARTMENT_OTHER): Payer: Self-pay | Admitting: Emergency Medicine

## 2022-03-19 ENCOUNTER — Emergency Department (HOSPITAL_BASED_OUTPATIENT_CLINIC_OR_DEPARTMENT_OTHER): Payer: No Typology Code available for payment source

## 2022-03-19 ENCOUNTER — Emergency Department (HOSPITAL_BASED_OUTPATIENT_CLINIC_OR_DEPARTMENT_OTHER)
Admission: EM | Admit: 2022-03-19 | Discharge: 2022-03-20 | Disposition: A | Discharge status: Home / Self Care | Payer: No Typology Code available for payment source | Attending: Emergency Medicine | Admitting: Emergency Medicine

## 2022-03-19 DIAGNOSIS — Z21 Asymptomatic human immunodeficiency virus [HIV] infection status: Secondary | ICD-10-CM | POA: Diagnosis not present

## 2022-03-19 DIAGNOSIS — N760 Acute vaginitis: Secondary | ICD-10-CM | POA: Insufficient documentation

## 2022-03-19 DIAGNOSIS — R102 Pelvic and perineal pain: Secondary | ICD-10-CM | POA: Diagnosis present

## 2022-03-19 DIAGNOSIS — Z79899 Other long term (current) drug therapy: Secondary | ICD-10-CM | POA: Diagnosis not present

## 2022-03-19 DIAGNOSIS — E119 Type 2 diabetes mellitus without complications: Secondary | ICD-10-CM | POA: Diagnosis not present

## 2022-03-19 DIAGNOSIS — R1032 Left lower quadrant pain: Secondary | ICD-10-CM

## 2022-03-19 DIAGNOSIS — B9689 Other specified bacterial agents as the cause of diseases classified elsewhere: Secondary | ICD-10-CM

## 2022-03-19 LAB — CBC WITH DIFFERENTIAL/PLATELET
Abs Immature Granulocytes: 0.02 10*3/uL (ref 0.00–0.07)
Basophils Absolute: 0.1 10*3/uL (ref 0.0–0.1)
Basophils Relative: 1 %
Eosinophils Absolute: 0.4 10*3/uL (ref 0.0–0.5)
Eosinophils Relative: 5 %
HCT: 38.1 % (ref 36.0–46.0)
Hemoglobin: 11.9 g/dL — ABNORMAL LOW (ref 12.0–15.0)
Immature Granulocytes: 0 %
Lymphocytes Relative: 21 %
Lymphs Abs: 1.8 10*3/uL (ref 0.7–4.0)
MCH: 24.8 pg — ABNORMAL LOW (ref 26.0–34.0)
MCHC: 31.2 g/dL (ref 30.0–36.0)
MCV: 79.4 fL — ABNORMAL LOW (ref 80.0–100.0)
Monocytes Absolute: 0.5 10*3/uL (ref 0.1–1.0)
Monocytes Relative: 6 %
Neutro Abs: 5.8 10*3/uL (ref 1.7–7.7)
Neutrophils Relative %: 67 %
Platelets: 237 10*3/uL (ref 150–400)
RBC: 4.8 MIL/uL (ref 3.87–5.11)
RDW: 15.8 % — ABNORMAL HIGH (ref 11.5–15.5)
WBC: 8.5 10*3/uL (ref 4.0–10.5)
nRBC: 0 % (ref 0.0–0.2)

## 2022-03-19 LAB — URINALYSIS, ROUTINE W REFLEX MICROSCOPIC
Bilirubin Urine: NEGATIVE
Glucose, UA: NEGATIVE mg/dL
Hgb urine dipstick: NEGATIVE
Ketones, ur: NEGATIVE mg/dL
Leukocytes,Ua: NEGATIVE
Nitrite: NEGATIVE
Protein, ur: NEGATIVE mg/dL
Specific Gravity, Urine: >=1.03 (ref 1.005–1.030)
pH: 5.5 (ref 5.0–8.0)

## 2022-03-19 LAB — PREGNANCY, URINE: Preg Test, Ur: NEGATIVE

## 2022-03-19 LAB — WET PREP, GENITAL
Sperm: NONE SEEN
Trich, Wet Prep: NONE SEEN
WBC, Wet Prep HPF POC: <10 (ref <10)
Yeast Wet Prep HPF POC: NONE SEEN

## 2022-03-19 LAB — COMPREHENSIVE METABOLIC PANEL
ALT: 16 U/L (ref 0–44)
AST: 19 U/L (ref 15–41)
Albumin: 3.5 g/dL (ref 3.5–5.0)
Alkaline Phosphatase: 86 U/L (ref 38–126)
Anion gap: 7 (ref 5–15)
BUN: 18 mg/dL (ref 6–20)
CO2: 26 mmol/L (ref 22–32)
Calcium: 8.5 mg/dL — ABNORMAL LOW (ref 8.9–10.3)
Chloride: 108 mmol/L (ref 98–111)
Creatinine, Ser: 0.9 mg/dL (ref 0.44–1.00)
GFR, Estimated: >60 mL/min (ref >60)
Glucose, Bld: 117 mg/dL — ABNORMAL HIGH (ref 70–99)
Potassium: 4.1 mmol/L (ref 3.5–5.1)
Sodium: 141 mmol/L (ref 135–145)
Total Bilirubin: 0.5 mg/dL (ref 0.3–1.2)
Total Protein: 7.2 g/dL (ref 6.5–8.1)

## 2022-03-19 LAB — LIPASE, BLOOD: Lipase: 32 U/L (ref 11–51)

## 2022-03-19 MED ORDER — ONDANSETRON HCL 4 MG/2ML IJ SOLN
4.0000 mg | Freq: Once | INTRAMUSCULAR | Status: AC
  Administered 2022-03-19: 4 mg via INTRAVENOUS
  Filled 2022-03-19: qty 2

## 2022-03-19 MED ORDER — MORPHINE SULFATE (PF) 4 MG/ML IV SOLN
4.0000 mg | Freq: Once | INTRAVENOUS | Status: AC
  Administered 2022-03-19: 4 mg via INTRAVENOUS
  Filled 2022-03-19: qty 1

## 2022-03-19 MED ORDER — LACTATED RINGERS IV BOLUS
1000.0000 mL | Freq: Once | INTRAVENOUS | Status: AC
  Administered 2022-03-19: 1000 mL via INTRAVENOUS

## 2022-03-19 MED ORDER — IOHEXOL 300 MG/ML  SOLN
100.0000 mL | Freq: Once | INTRAMUSCULAR | Status: AC | PRN
  Administered 2022-03-19: 100 mL via INTRAVENOUS

## 2022-03-19 MED ORDER — METRONIDAZOLE 500 MG PO TABS: 500.0000 mg | ORAL_TABLET | Freq: Two times a day (BID) | ORAL | 0 refills | Status: DC

## 2022-03-19 NOTE — ED Triage Notes (Signed)
Constant left sided pelvic pain that started today. Describes pain as cramping. Denies vaginal discharge, abnormal Bms, urinary sx.

## 2022-03-19 NOTE — ED Provider Notes (Cosign Needed)
[facility] EMERGENCY DEPARTMENT Provider Note   CSN: [id number] Arrival date & time: 03/19/22  1844     History  Chief Complaint  Patient presents with   Pelvic Pain    Natasha Winters is a 51 y.o. female.  51 year old female presents today for evaluation of left lower quadrant abdominal pain/pelvic pain ongoing since today.  Denies dysuria, vaginal discharge.  Does have history of HIV.  Is in a monogamous relationship.  Denies history of diverticulitis.  Denies fever, chills, nausea, vomiting, diarrhea, constipation.  Denies blood in stool.  The history is provided by the patient. No language interpreter was used.       Home Medications Prior to Admission medications   Medication Sig Start Date End Date Taking? Authorizing Provider  benzonatate (TESSALON) 100 MG capsule Take 1 capsule (100 mg total) by mouth every 8 (eight) hours. 05/24/21   Kommor, Madison, MD  conjugated estrogens (PREMARIN) vaginal cream Place vaginally. 03/17/20   [provider]  DULoxetine (CYMBALTA) 30 MG capsule Take 1 tablet by mouth daily. 04/10/20   [provider]  elvitegravir-cobicistat-emtricitabine-tenofovir (GENVOYA) 150-150-200-10 MG TABS tablet Take by mouth. 01/31/17   [provider]  ibuprofen (ADVIL) 800 MG tablet Take by mouth. 07/20/16   [provider]  metFORMIN (GLUCOPHAGE) 500 MG tablet Take by mouth. 03/17/20   [provider]  methocarbamol (ROBAXIN) 500 MG tablet Take 1 tablet (500 mg total) by mouth 2 (two) times daily. 09/14/21   Tacy Learn, PA-C  metroNIDAZOLE (FLAGYL) 500 MG tablet Take 1 tablet (500 mg total) by mouth 2 (two) times daily. 08/28/21   Smoot, Leary Roca, PA-C  ondansetron (ZOFRAN) 4 MG tablet Take 1 tablet (4 mg total) by mouth every 8 (eight) hours as needed for nausea or vomiting. 10/06/19   Caccavale, Sophia, PA-C  phenazopyridine (PYRIDIUM) 200 MG tablet Take 1 tablet (200 mg total) by mouth 3 (three) times  daily. 09/14/21   Tacy Learn, PA-C      Allergies    Doxycycline    Review of Systems   Review of Systems  Constitutional:  Negative for chills and fever.  Gastrointestinal:  Positive for abdominal pain. Negative for nausea and vomiting.  Genitourinary:  Positive for pelvic pain. Negative for dysuria, flank pain, vaginal bleeding and vaginal discharge.  All other systems reviewed and are negative.   Physical Exam Updated Vital Signs BP 93/75   Pulse 78   Temp 97.9 F (36.6 C) (Oral)   Resp 18   Ht 5\' 7"  (1.702 m)   Wt 119.7 kg   LMP 11/22/2013 Comment: not preg, not sexually active  SpO2 99%   BMI 41.35 kg/m  Physical Exam Vitals and nursing note reviewed. Exam conducted with a chaperone present.  Constitutional:      General: She is not in acute distress.    Appearance: Normal appearance. She is not ill-appearing.  HENT:     Head: Normocephalic and atraumatic.     Nose: Nose normal.  Eyes:     Conjunctiva/sclera: Conjunctivae normal.  Cardiovascular:     Rate and Rhythm: Normal rate and regular rhythm.  Pulmonary:     Effort: Pulmonary effort is normal. No respiratory distress.     Breath sounds: No wheezing.  Abdominal:     General: There is no distension.     Palpations: Abdomen is soft.     Tenderness: There is abdominal tenderness (Left lower quadrant). There is no right CVA tenderness, left  CVA tenderness or guarding.  Genitourinary:    Vagina: Normal. No vaginal discharge or bleeding.     Cervix: No discharge, erythema or cervical bleeding.  Musculoskeletal:        General: No deformity.  Skin:    Findings: No rash.  Neurological:     Mental Status: She is alert.     ED Results / Procedures / Treatments   Labs (all labs ordered are listed, but only abnormal results are displayed) Labs Reviewed  WET PREP, GENITAL - Abnormal; Notable for the following components:      Result Value   Clue Cells Wet Prep HPF POC PRESENT (*)    All other  components within normal limits  COMPREHENSIVE METABOLIC PANEL - Abnormal; Notable for the following components:   Glucose, Bld 117 (*)    Calcium 8.5 (*)    All other components within normal limits  URINALYSIS, ROUTINE W REFLEX MICROSCOPIC - Abnormal; Notable for the following components:   APPearance HAZY (*)    All other components within normal limits  CBC WITH DIFFERENTIAL/PLATELET - Abnormal; Notable for the following components:   Hemoglobin 11.9 (*)    MCV 79.4 (*)    MCH 24.8 (*)    RDW 15.8 (*)    All other components within normal limits  PREGNANCY, URINE  LIPASE, BLOOD  GC/CHLAMYDIA PROBE AMP ([place]) NOT AT [facility]    EKG None  Radiology No results found.  Procedures Procedures    Medications Ordered in ED Medications  lactated ringers bolus 1,000 mL (1,000 mLs Intravenous New Bag/Given 03/19/22 2244)  morphine (PF) 4 MG/ML injection 4 mg (4 mg Intravenous Given 03/19/22 2309)  ondansetron (ZOFRAN) injection 4 mg (4 mg Intravenous Given 03/19/22 2309)    ED Course/ Medical Decision Making/ A&P                           Medical Decision Making Amount and/or Complexity of Data Reviewed Labs: ordered. Radiology: ordered.  Risk Prescription drug management.   Medical Decision Making / ED Course   This patient presents to the ED for concern of abdominal pain, this involves an extensive number of treatment options, and is a complaint that carries with it a high risk of complications and morbidity.  The differential diagnosis includes diverticulitis, appendicitis, UTI, pyelonephritis, pancreatitis  MDM: 51 year old female presents today for evaluation of left lower abdominal pain ongoing since this morning.  She describes it as constant dull pain.  She has not taken anything prior to arrival.  Denies fever at home.  Does have a history of HIV and is compliant with her regimen.  UA without evidence of UTI.  CMP with preserved renal function, normal  electrolytes. Without.  Normal LFTs.  CBC with out leukocytosis, mild anemia at 11.9.  Slightly decreased from recent hemoglobin of 12.5.  Pelvic exam performed.  Without signs of PID.  Wet prep with clue cells.  Gonorrhea chlamydia collected.  CT abdomen pelvis pending.  Patient given a liter of fluid bolus, and pain medication.  Patient at the end my shift is awaiting CT scan and reevaluation.  Patient signed out to Dr. Roxanne Mins at the end of my shift.  Lab Tests: -I ordered, reviewed, and interpreted labs.   The pertinent results include:   Labs Reviewed  WET PREP, GENITAL - Abnormal; Notable for the following components:      Result Value   Clue Cells Wet Prep HPF POC  PRESENT (*)    All other components within normal limits  COMPREHENSIVE METABOLIC PANEL - Abnormal; Notable for the following components:   Glucose, Bld 117 (*)    Calcium 8.5 (*)    All other components within normal limits  URINALYSIS, ROUTINE W REFLEX MICROSCOPIC - Abnormal; Notable for the following components:   APPearance HAZY (*)    All other components within normal limits  CBC WITH DIFFERENTIAL/PLATELET - Abnormal; Notable for the following components:   Hemoglobin 11.9 (*)    MCV 79.4 (*)    MCH 24.8 (*)    RDW 15.8 (*)    All other components within normal limits  PREGNANCY, URINE  LIPASE, BLOOD  GC/CHLAMYDIA PROBE AMP ([place]) NOT AT [facility]      EKG  EKG Interpretation  Date/Time:    Ventricular Rate:    PR Interval:    QRS Duration:   QT Interval:    QTC Calculation:   R Axis:     Text Interpretation:           Imaging Studies ordered: I ordered imaging studies including ct abdomen pelvis with contrast I independently visualized and interpreted imaging. I agree with the radiologist interpretation   Medicines ordered and prescription drug management: Meds ordered this encounter  Medications   lactated ringers bolus 1,000 mL   morphine (PF) 4 MG/ML injection 4 mg   ondansetron  (ZOFRAN) injection 4 mg    -I have reviewed the patients home medicines and have made adjustments as needed  Critical interventions Pain medication, IV fluids  Co morbidities that complicate the patient evaluation  Past Medical History:  Diagnosis Date   Diabetes mellitus without complication ([place])    HIV (human immunodeficiency virus infection) ([place])       Dispostion: Patient at the end my shift is awaiting reevaluation following pain medication, and CT abdomen pelvis with contrast.  Patient signed out to Dr. Roxanne Mins.  Final Clinical Impression(s) / ED Diagnoses Final diagnoses:  BV (bacterial vaginosis)    Rx / DC Orders ED Discharge Orders          Ordered    metroNIDAZOLE (FLAGYL) 500 MG tablet  2 times daily        03/19/22 2355              Evlyn Courier, PA-C 03/19/22 2355

## 2022-03-20 LAB — GC/CHLAMYDIA PROBE AMP (~~LOC~~) NOT AT ARMC
Chlamydia: NEGATIVE
Comment: NEGATIVE
Comment: NORMAL
Neisseria Gonorrhea: NEGATIVE

## 2022-03-20 MED ORDER — ONDANSETRON 4 MG PO TBDP: 4.0000 mg | ORAL_TABLET | Freq: Three times a day (TID) | ORAL | 0 refills | Status: AC | PRN

## 2022-03-20 MED ORDER — DICYCLOMINE HCL 20 MG PO TABS: 20.0000 mg | ORAL_TABLET | Freq: Two times a day (BID) | ORAL | 0 refills | Status: AC

## 2022-03-20 NOTE — Discharge Instructions (Addendum)
Your work-up today was overall reassuring.  No concerning findings on your CT scan.  Your wet prep showed evidence of clue cells which is consistent with bacterial vaginosis.  Have sent antibiotics into the pharmacy for you.  I have also sent Bentyl, Zofran for abdominal pain and nausea as needed.  Follow-up with your PCP.  For any concerning symptoms please return to the emergency room such as fever, inability tolerate p.o. intake despite taking Zofran, severe abdominal pain. If you do not have a primary care provider have attached information for [place], health and wellness clinic for you to establish care with.

## 2022-06-06 ENCOUNTER — Encounter (HOSPITAL_BASED_OUTPATIENT_CLINIC_OR_DEPARTMENT_OTHER): Payer: Self-pay | Admitting: Emergency Medicine

## 2022-06-06 ENCOUNTER — Other Ambulatory Visit: Payer: Self-pay

## 2022-06-06 ENCOUNTER — Emergency Department (HOSPITAL_BASED_OUTPATIENT_CLINIC_OR_DEPARTMENT_OTHER)
Admission: EM | Admit: 2022-06-06 | Discharge: 2022-06-07 | Disposition: A | Discharge status: Home / Self Care | Payer: No Typology Code available for payment source | Attending: Emergency Medicine | Admitting: Emergency Medicine

## 2022-06-06 DIAGNOSIS — R1084 Generalized abdominal pain: Secondary | ICD-10-CM | POA: Diagnosis not present

## 2022-06-06 DIAGNOSIS — R1030 Lower abdominal pain, unspecified: Secondary | ICD-10-CM | POA: Diagnosis present

## 2022-06-06 LAB — URINALYSIS, ROUTINE W REFLEX MICROSCOPIC
Bilirubin Urine: NEGATIVE
Glucose, UA: NEGATIVE mg/dL
Hgb urine dipstick: NEGATIVE
Ketones, ur: NEGATIVE mg/dL
Leukocytes,Ua: NEGATIVE
Nitrite: NEGATIVE
Protein, ur: NEGATIVE mg/dL
Specific Gravity, Urine: >=1.03 (ref 1.005–1.030)
pH: 5 (ref 5.0–8.0)

## 2022-06-06 LAB — PREGNANCY, URINE: Preg Test, Ur: NEGATIVE

## 2022-06-06 NOTE — ED Triage Notes (Signed)
Pt reports generalized abdominal pain starting at 4pm today.  Denies N/V/D.  Pt reports she had episodes last week as well.

## 2022-06-07 ENCOUNTER — Emergency Department (HOSPITAL_BASED_OUTPATIENT_CLINIC_OR_DEPARTMENT_OTHER): Payer: No Typology Code available for payment source

## 2022-06-07 LAB — CBC
HCT: 43.3 % (ref 36.0–46.0)
Hemoglobin: 13.3 g/dL (ref 12.0–15.0)
MCH: 24.7 pg — ABNORMAL LOW (ref 26.0–34.0)
MCHC: 30.7 g/dL (ref 30.0–36.0)
MCV: 80.5 fL (ref 80.0–100.0)
Platelets: 274 10*3/uL (ref 150–400)
RBC: 5.38 MIL/uL — ABNORMAL HIGH (ref 3.87–5.11)
RDW: 15.8 % — ABNORMAL HIGH (ref 11.5–15.5)
WBC: 9.6 10*3/uL (ref 4.0–10.5)
nRBC: 0 % (ref 0.0–0.2)

## 2022-06-07 LAB — COMPREHENSIVE METABOLIC PANEL
ALT: 22 U/L (ref 0–44)
AST: 24 U/L (ref 15–41)
Albumin: 3.7 g/dL (ref 3.5–5.0)
Alkaline Phosphatase: 87 U/L (ref 38–126)
Anion gap: 6 (ref 5–15)
BUN: 11 mg/dL (ref 6–20)
CO2: 26 mmol/L (ref 22–32)
Calcium: 8.5 mg/dL — ABNORMAL LOW (ref 8.9–10.3)
Chloride: 104 mmol/L (ref 98–111)
Creatinine, Ser: 0.76 mg/dL (ref 0.44–1.00)
GFR, Estimated: >60 mL/min (ref >60)
Glucose, Bld: 123 mg/dL — ABNORMAL HIGH (ref 70–99)
Potassium: 3.8 mmol/L (ref 3.5–5.1)
Sodium: 136 mmol/L (ref 135–145)
Total Bilirubin: 0.7 mg/dL (ref 0.3–1.2)
Total Protein: 7.9 g/dL (ref 6.5–8.1)

## 2022-06-07 LAB — LIPASE, BLOOD: Lipase: 30 U/L (ref 11–51)

## 2022-06-07 MED ORDER — IOHEXOL 300 MG/ML  SOLN
100.0000 mL | Freq: Once | INTRAMUSCULAR | Status: AC | PRN
  Administered 2022-06-07: 100 mL via INTRAVENOUS

## 2022-06-07 NOTE — ED Provider Notes (Signed)
[facility] EMERGENCY DEPARTMENT  Provider Note  CSN: [id number] Arrival date & time: 06/06/22 1926  History Chief Complaint  Patient presents with   Abdominal Pain    Natasha Winters is a 51 y.o. female here for evaluation of 4 days of intermittent sharp, lower abdominal pain, not associated with fever, vomiting, diarrhea or dysuria. She has had appendectomy. She is menopausal, but still has pelvic organs. No particular provoking or relieving factors.    Home Medications Prior to Admission medications   Medication Sig Start Date End Date Taking? Authorizing Provider  benzonatate (TESSALON) 100 MG capsule Take 1 capsule (100 mg total) by mouth every 8 (eight) hours. 05/24/21   Kommor, Madison, MD  conjugated estrogens (PREMARIN) vaginal cream Place vaginally. 03/17/20   [provider]  dicyclomine (BENTYL) 20 MG tablet Take 1 tablet (20 mg total) by mouth 2 (two) times daily. 03/20/22   Marita Kansas, PA-C  DULoxetine (CYMBALTA) 30 MG capsule Take 1 tablet by mouth daily. 04/10/20   [provider]  elvitegravir-cobicistat-emtricitabine-tenofovir (GENVOYA) 150-150-200-10 MG TABS tablet Take by mouth. 01/31/17   [provider]  ibuprofen (ADVIL) 800 MG tablet Take by mouth. 07/20/16   [provider]  metFORMIN (GLUCOPHAGE) 500 MG tablet Take by mouth. 03/17/20   [provider]  methocarbamol (ROBAXIN) 500 MG tablet Take 1 tablet (500 mg total) by mouth 2 (two) times daily. 09/14/21   Jeannie Fend, PA-C  metroNIDAZOLE (FLAGYL) 500 MG tablet Take 1 tablet (500 mg total) by mouth 2 (two) times daily. 03/19/22   Karie Mainland, Amjad, PA-C  ondansetron (ZOFRAN-ODT) 4 MG disintegrating tablet Take 1 tablet (4 mg total) by mouth every 8 (eight) hours as needed. 03/20/22   Marita Kansas, PA-C  phenazopyridine (PYRIDIUM) 200 MG tablet Take 1 tablet (200 mg total) by mouth 3 (three) times daily. 09/14/21   Jeannie Fend, PA-C     Allergies     Doxycycline   Review of Systems   Review of Systems Please see HPI for pertinent positives and negatives  Physical Exam BP 138/87   Pulse 65   Temp 98 F (36.7 C) (Oral)   Resp 16   Ht 5\' 7"  (1.702 m)   Wt 120.2 kg   LMP 11/22/2013 Comment: not preg, not sexually active  SpO2 97%   BMI 41.50 kg/m   Physical Exam Vitals and nursing note reviewed.  Constitutional:      Appearance: Normal appearance.  HENT:     Head: Normocephalic and atraumatic.     Nose: Nose normal.     Mouth/Throat:     Mouth: Mucous membranes are moist.  Eyes:     Extraocular Movements: Extraocular movements intact.     Conjunctiva/sclera: Conjunctivae normal.  Cardiovascular:     Rate and Rhythm: Normal rate.  Pulmonary:     Effort: Pulmonary effort is normal.     Breath sounds: Normal breath sounds.  Abdominal:     General: Abdomen is flat.     Palpations: Abdomen is soft.     Tenderness: There is abdominal tenderness in the right lower quadrant and left lower quadrant. There is no guarding. Negative signs include Murphy's sign and McBurney's sign.  Musculoskeletal:        General: No swelling. Normal range of motion.     Cervical back: Neck supple.  Skin:    General: Skin is warm and dry.  Neurological:     General: No focal deficit present.  Mental Status: She is alert.  Psychiatric:        Mood and Affect: Mood normal.     ED Results / Procedures / Treatments   EKG None  Procedures Procedures  Medications Ordered in the ED Medications  iohexol (OMNIPAQUE) 300 MG/ML solution 100 mL (100 mLs Intravenous Contrast Given 06/07/22 0105)    Initial Impression and Plan  Patient here with lower abdominal pain R>L on exam. No other concerning symptoms. She has had her appendix out many years ago. Labs done in triage show neg HCG and clear UA. Blood work is in process. Will send for CT to rule out diverticulitis or other acute process.   ED Course   Clinical Course as of  06/07/22 0134  Fri Jun 07, 2022  0109 CBC, CMP and lipase are unremarkable.  [CS]  [id number] I personally viewed the images from radiology studies and agree with radiologist interpretation: CT is neg for acute process. No clear etiology of abdominal pain identified today but does not appear to be an acute life threatening or surgical process. Abdomen is benign and there is no indication for further ED workup. Recommend PCP follow up if not improving, RTED for any other concerns.  [CS]    Clinical Course User Index [CS] Truddie Hidden, MD     MDM Rules/Calculators/A&P Medical Decision Making Problems Addressed: Generalized abdominal pain: acute illness or injury  Amount and/or Complexity of Data Reviewed Labs: ordered. Decision-making details documented in ED Course. Radiology: ordered and independent interpretation performed. Decision-making details documented in ED Course.  Risk Prescription drug management.    Final Clinical Impression(s) / ED Diagnoses Final diagnoses:  Generalized abdominal pain    Rx / DC Orders ED Discharge Orders     None        Truddie Hidden, MD 06/07/22 [phone]

## 2022-06-07 NOTE — ED Notes (Addendum)
Pt awake and alert lying in bed; GCS 15.  RR even and unlabored on RA with symmetrical rise and fall of chest.  Abdomen soft round TTP - pt reports more tender to RLQ.  Pt states pain has been intermittent since onset earlier in afternoon; states episode of abd pain week pta resolved much sooner than current pain episodic events.  Denies n/v/d; denies fever/chills.  VSS.  Lab results pending.  Will continue to monitor for acute changes and maintain plan of care as pt awaits ED provider.

## 2022-06-07 NOTE — ED Notes (Signed)
Pt agreeable with d/c plan as discussed by provider- this nurse has verbally reinforced d/c instructions and provided pt with written copy - pt acknowledges verbal understanding and denies any addl questions, concerns, needs- ambulatory independently at d/c with steady gait; vitals stable; no distress.  

## 2022-08-26 IMAGING — DX DG ANKLE COMPLETE 3+V*R*
3 series · 3 of 3 positions shown · non-contrast
Comparison: None.

CLINICAL DATA: Pain.  Swelling.

EXAM:
RIGHT ANKLE - COMPLETE 3+ VIEW

[ankle ap]
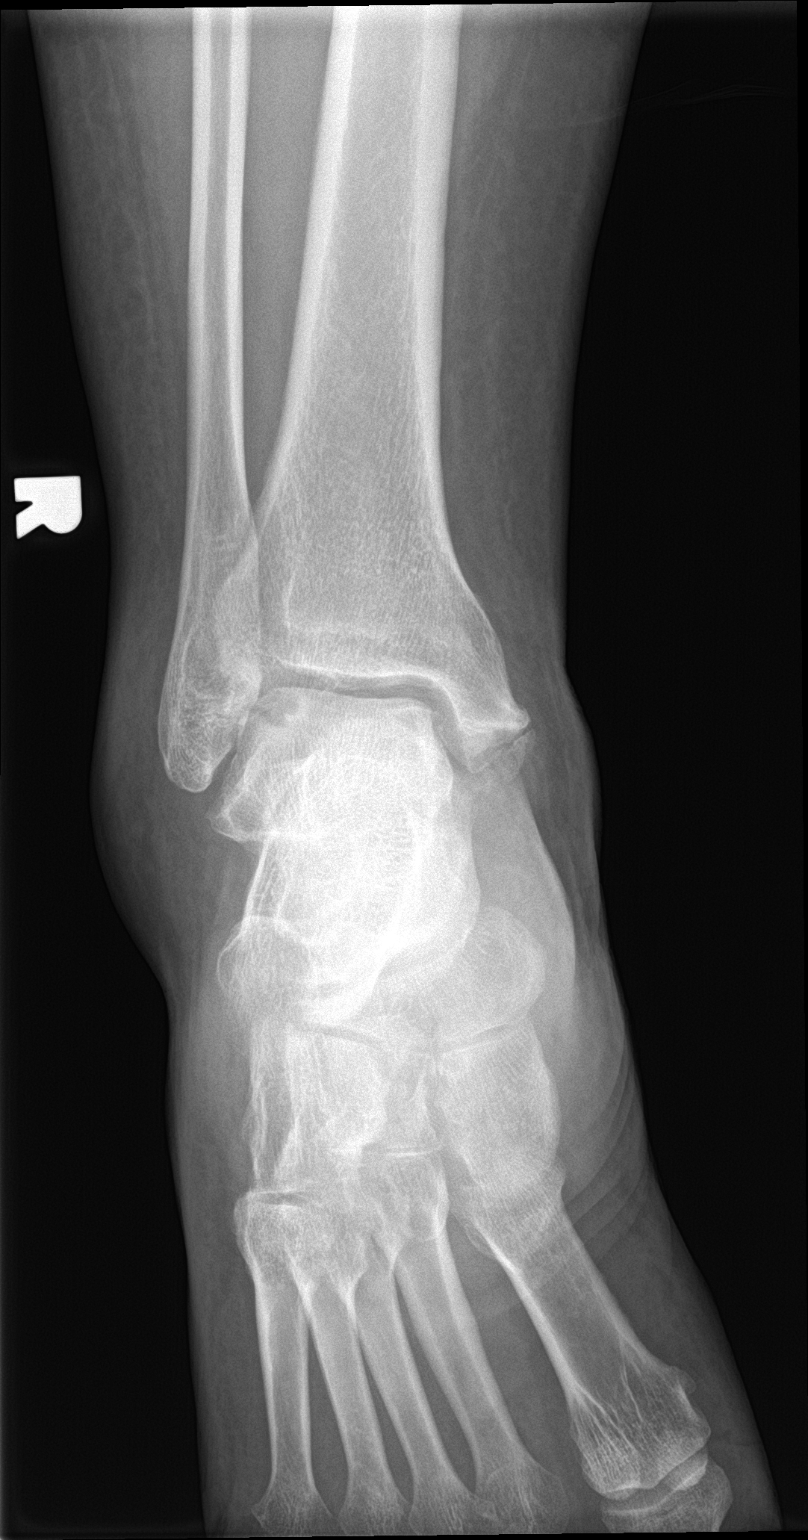

[ankle obl]
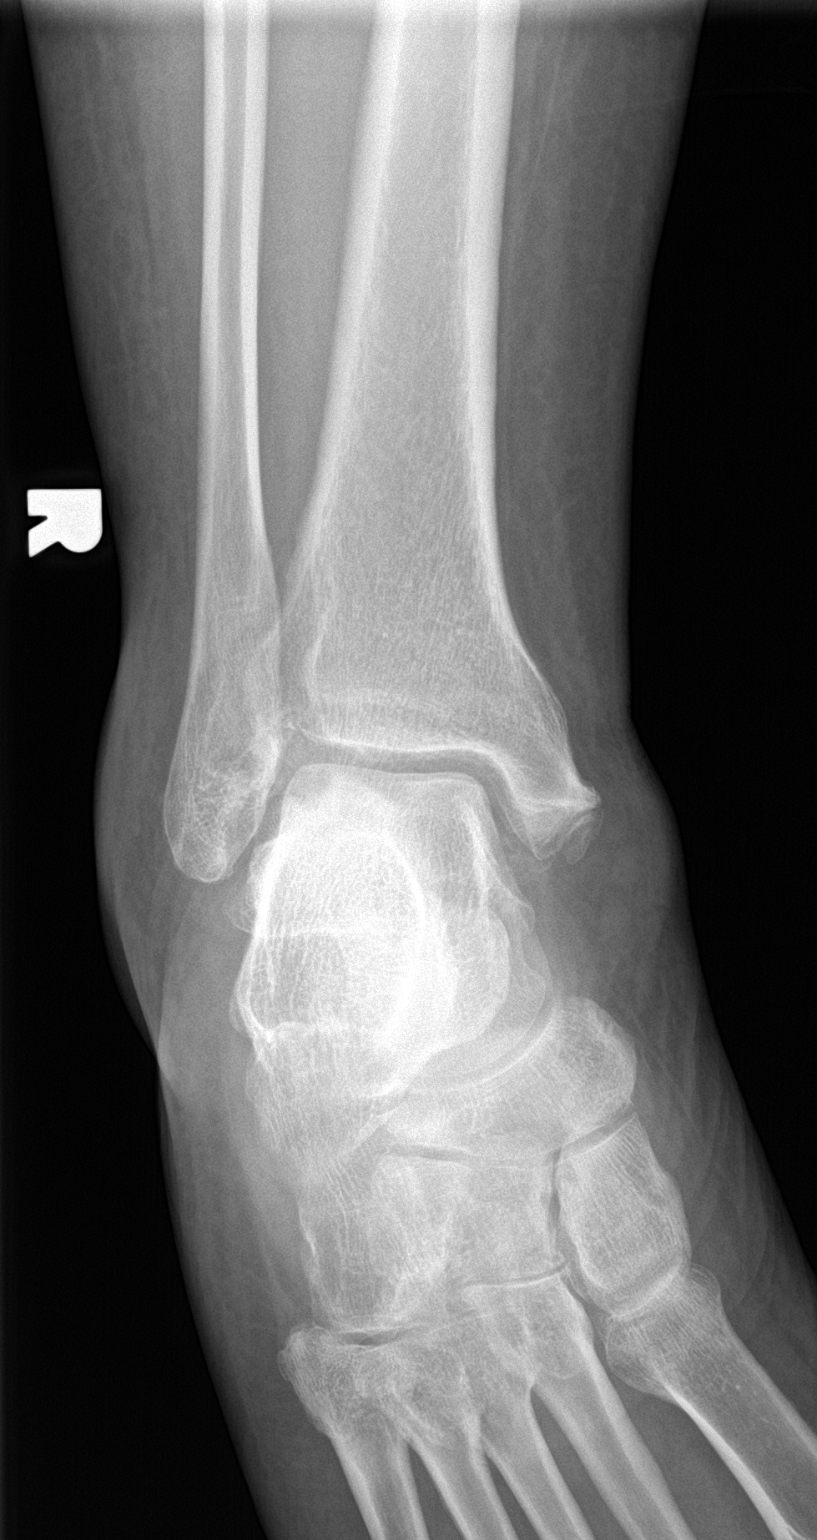

[ankle lat]
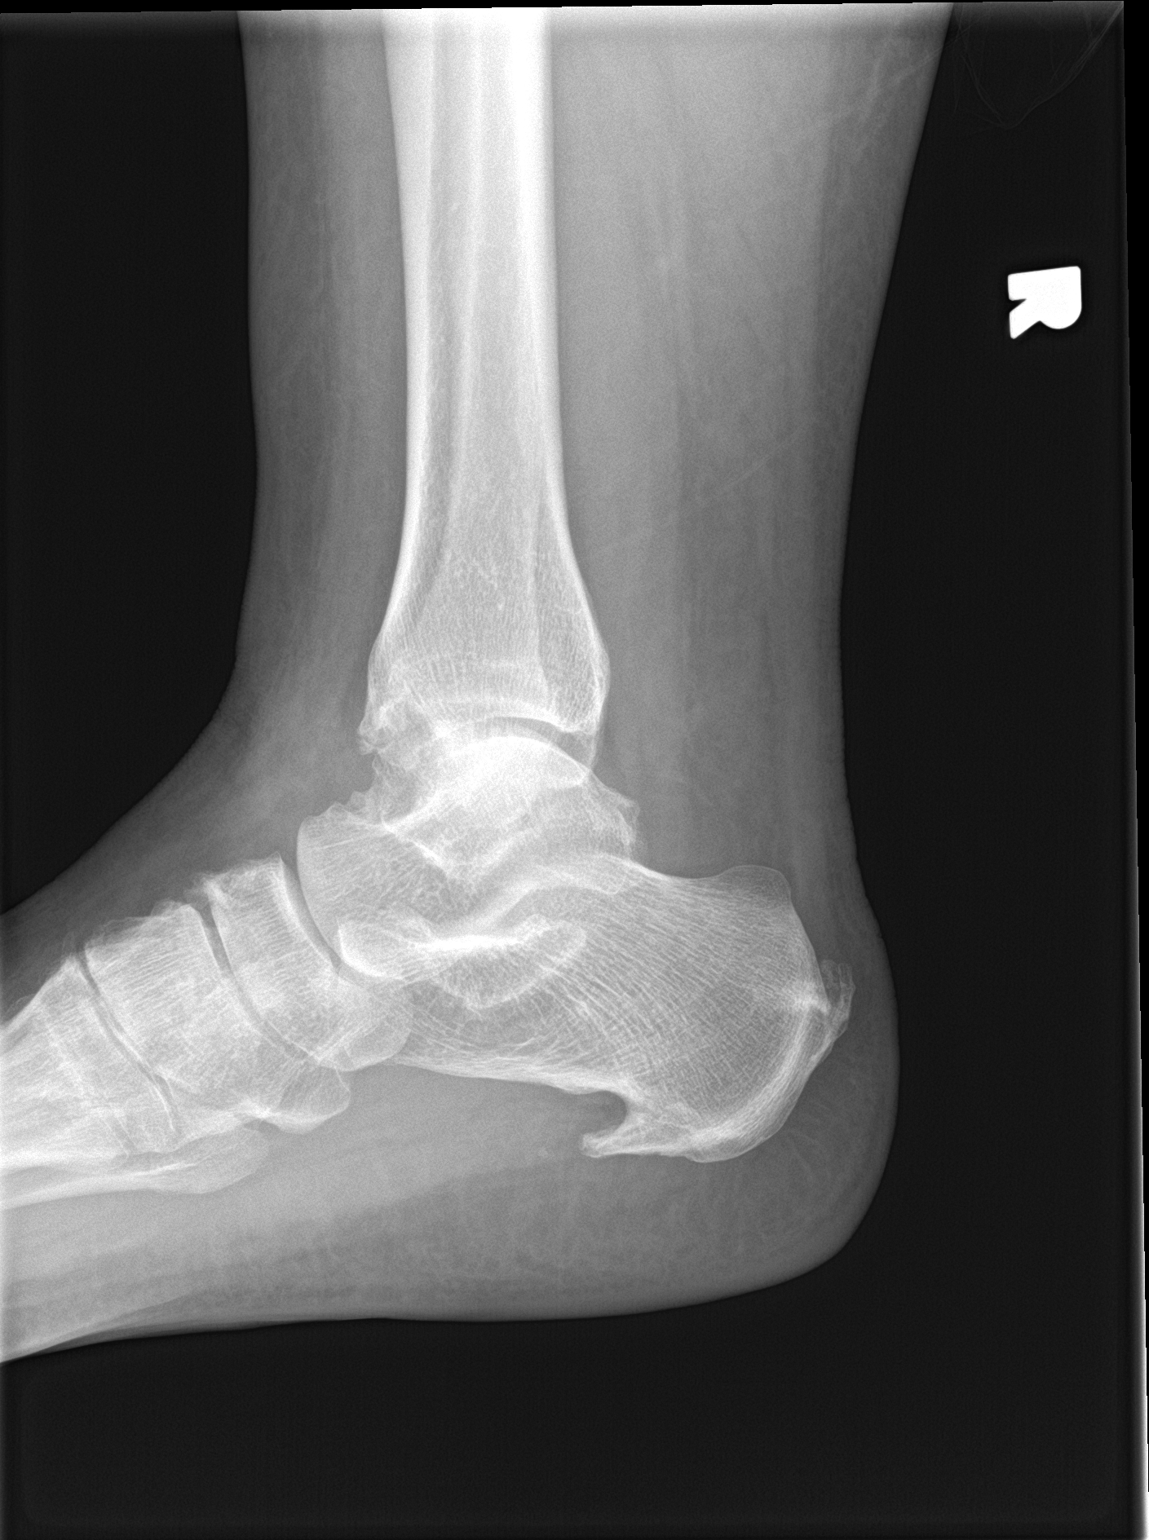

[3 of 3 positions shown; findings below may reference images not displayed]

FINDINGS: Soft tissue swelling, particularly laterally. Soft tissue
calcifications adjacent to the medial malleolus are likely due to
remote avulsion injury. No acute fractures identified. Lucency
projected over the lateral talar dome on both frontal in oblique
images suggest a subchondral defect. Enthesopathic changes at the
Achilles insertion site on the posterior calcaneus. Calcaneal spur.
Degenerative changes in the mid and hindfoot.
IMPRESSION: 1. Soft tissue swelling, particularly laterally.  No acute fracture.
2. Evidence of remote medial malleolar avulsion injury. Degenerative
changes in the midfoot, hindfoot, and ankle. Possible subchondral
lucency in the lateral talar dome.
3. No other abnormalities.

## 2022-09-16 ENCOUNTER — Encounter (HOSPITAL_BASED_OUTPATIENT_CLINIC_OR_DEPARTMENT_OTHER): Payer: Self-pay

## 2022-09-16 ENCOUNTER — Emergency Department (HOSPITAL_BASED_OUTPATIENT_CLINIC_OR_DEPARTMENT_OTHER)
Admission: EM | Admit: 2022-09-16 | Discharge: 2022-09-16 | Disposition: A | Discharge status: Home / Self Care | Payer: No Typology Code available for payment source | Attending: Emergency Medicine | Admitting: Emergency Medicine

## 2022-09-16 ENCOUNTER — Other Ambulatory Visit: Payer: Self-pay

## 2022-09-16 ENCOUNTER — Emergency Department (HOSPITAL_BASED_OUTPATIENT_CLINIC_OR_DEPARTMENT_OTHER): Payer: No Typology Code available for payment source

## 2022-09-16 DIAGNOSIS — M542 Cervicalgia: Secondary | ICD-10-CM | POA: Diagnosis present

## 2022-09-16 DIAGNOSIS — M436 Torticollis: Secondary | ICD-10-CM | POA: Insufficient documentation

## 2022-09-16 MED ORDER — DICLOFENAC SODIUM 75 MG PO TBEC: 75.0000 mg | DELAYED_RELEASE_TABLET | Freq: Two times a day (BID) | ORAL | 0 refills | Status: AC

## 2022-09-16 MED ORDER — DIAZEPAM 5 MG PO TABS: 5.0000 mg | ORAL_TABLET | Freq: Three times a day (TID) | ORAL | 0 refills | Status: AC | PRN

## 2022-09-16 MED ORDER — KETOROLAC TROMETHAMINE 30 MG/ML IJ SOLN
30.0000 mg | Freq: Once | INTRAMUSCULAR | Status: AC
  Administered 2022-09-16: 30 mg via INTRAMUSCULAR
  Filled 2022-09-16: qty 1

## 2022-09-16 NOTE — ED Provider Notes (Signed)
[place] [facility] Provider Note   CSN: [id number] Arrival date & time: 09/16/22  [id number]     History  Chief Complaint  Patient presents with   Neck Pain   Arm Pain    Natasha Winters is a 52 y.o. female.  Pt complains of soreness in her neck on the left side,  discomfort in arm and shoulder.  Pt reports difficulty turning her head to the left.  Pt has tried ibuprofen and tylenol with no relief.  No chest pain, no shortness of breath.  No fever or chills.  No cough.  Pt denies headache   The history is provided by the patient. No language interpreter was used.  Neck Pain Pain location:  Generalized neck Quality:  Aching Pain radiates to:  Does not radiate Pain severity:  Moderate Pain is:  Worse during the day Onset quality:  Gradual Timing:  Constant Progression:  Worsening Chronicity:  New Relieved by:  Nothing Worsened by:  Nothing Ineffective treatments:  None tried Associated symptoms: no numbness   Risk factors: no hx of spinal trauma, no recent head injury and no recurrent falls   Arm Pain       Home Medications Prior to Admission medications   Medication Sig Start Date End Date Taking? Authorizing Provider  benzonatate (TESSALON) 100 MG capsule Take 1 capsule (100 mg total) by mouth every 8 (eight) hours. 05/24/21   Kommor, Madison, MD  conjugated estrogens (PREMARIN) vaginal cream Place vaginally. 03/17/20   [provider]  dicyclomine (BENTYL) 20 MG tablet Take 1 tablet (20 mg total) by mouth 2 (two) times daily. 03/20/22   Evlyn Courier, PA-C  DULoxetine (CYMBALTA) 30 MG capsule Take 1 tablet by mouth daily. 04/10/20   [provider]  elvitegravir-cobicistat-emtricitabine-tenofovir (GENVOYA) 150-150-200-10 MG TABS tablet Take by mouth. 01/31/17   [provider]  ibuprofen (ADVIL) 800 MG tablet Take by mouth. 07/20/16   [provider]  metFORMIN (GLUCOPHAGE) 500 MG tablet Take by mouth.  03/17/20   [provider]  methocarbamol (ROBAXIN) 500 MG tablet Take 1 tablet (500 mg total) by mouth 2 (two) times daily. 09/14/21   Tacy Learn, PA-C  metroNIDAZOLE (FLAGYL) 500 MG tablet Take 1 tablet (500 mg total) by mouth 2 (two) times daily. 03/19/22   Deatra Canter, Amjad, PA-C  ondansetron (ZOFRAN-ODT) 4 MG disintegrating tablet Take 1 tablet (4 mg total) by mouth every 8 (eight) hours as needed. 03/20/22   Evlyn Courier, PA-C  phenazopyridine (PYRIDIUM) 200 MG tablet Take 1 tablet (200 mg total) by mouth 3 (three) times daily. 09/14/21   Tacy Learn, PA-C      Allergies    Patient has no known allergies.    Review of Systems   Review of Systems  Musculoskeletal:  Positive for neck pain.  Neurological:  Negative for numbness.  All other systems reviewed and are negative.   Physical Exam Updated Vital Signs BP (!) 147/86 (BP Location: Right Wrist)   Pulse 73   Temp 97.7 F (36.5 C) (Oral)   Resp 18   Ht 5\' 7"  (1.702 m)   Wt 117.5 kg   LMP 11/22/2013 Comment: not preg, not sexually active  SpO2 98%   BMI 40.57 kg/m  Physical Exam Vitals and nursing note reviewed.  Constitutional:      Appearance: She is well-developed.  HENT:     Head: Normocephalic.  Neck:     Comments: Tender left trapezius and left  sternocleidomastoid area  from shoulder   Cardiovascular:     Rate and Rhythm: Normal rate.  Pulmonary:     Effort: Pulmonary effort is normal.  Abdominal:     General: There is no distension.  Musculoskeletal:        General: Normal range of motion.  Skin:    General: Skin is warm.  Neurological:     General: No focal deficit present.     Mental Status: She is alert and oriented to person, place, and time.  Psychiatric:        Mood and Affect: Mood normal.     ED Results / Procedures / Treatments   Labs (all labs ordered are listed, but only abnormal results are displayed) Labs Reviewed - No data to display  EKG None  Radiology DG Cervical Spine  Complete  Result Date: 09/16/2022 CLINICAL DATA:  Pain EXAM: CERVICAL SPINE - COMPLETE 4+ VIEW COMPARISON:  07/23/2016 FINDINGS: There is no evidence of cervical spine fracture or prevertebral soft tissue swelling. Straightening of the cervical lordosis without static listhesis. Progressive disc height loss and endplate spurring at D34-534, C5-6, and C6-7. Oblique views demonstrate bony foraminal narrowing at these levels, most pronounced at C5-6 on the left secondary to facet and uncovertebral spurring. IMPRESSION: Progressive multilevel cervical spondylosis with foraminal narrowing most pronounced at C5-6 on the left. No acute findings. Electronically Signed   By: Davina Poke D.O.   On: 09/16/2022 10:16    Procedures Procedures    Medications Ordered in ED Medications  ketorolac (TORADOL) 30 MG/ML injection 30 mg (30 mg Intramuscular Given 09/16/22 1009)    ED Course/ Medical Decision Making/ A&P                             Medical Decision Making Pt complains of pain when trying to turn her head to the side   Amount and/or Complexity of Data Reviewed Independent Historian: parent Radiology: ordered and independent interpretation performed. Decision-making details documented in ED Course.    Details: Xray c spine shows cervical spondylosis, spurring and c5-c6 foraminal narrowing   Risk Prescription drug management. Risk Details: Pt given toradol injection.             Final Clinical Impression(s) / ED Diagnoses Final diagnoses:  Torticollis    Rx / DC Orders ED Discharge Orders          Ordered    diazepam (VALIUM) 5 MG tablet  Every 8 hours PRN        09/16/22 1049    diclofenac (VOLTAREN) 75 MG EC tablet  2 times daily        09/16/22 1049           An After Visit Summary was printed and given to the patient.    Fransico Meadow, [place] 09/16/22 1049    Wyvonnia Dusky, MD 09/16/22 [phone]

## 2022-09-16 NOTE — ED Triage Notes (Signed)
C/o left sided aches from left neck to left arm, muscle spasm in left neck. States pain started 3 days ago, worse last night.

## 2022-09-16 NOTE — Discharge Instructions (Addendum)
See your provider for recheck.

## 2022-09-27 ENCOUNTER — Emergency Department (HOSPITAL_BASED_OUTPATIENT_CLINIC_OR_DEPARTMENT_OTHER)
Admission: EM | Admit: 2022-09-27 | Discharge: 2022-09-27 | Disposition: A | Discharge status: Home / Self Care | Payer: No Typology Code available for payment source | Attending: Emergency Medicine | Admitting: Emergency Medicine

## 2022-09-27 ENCOUNTER — Other Ambulatory Visit: Payer: Self-pay

## 2022-09-27 ENCOUNTER — Emergency Department (HOSPITAL_BASED_OUTPATIENT_CLINIC_OR_DEPARTMENT_OTHER): Payer: No Typology Code available for payment source

## 2022-09-27 ENCOUNTER — Encounter (HOSPITAL_BASED_OUTPATIENT_CLINIC_OR_DEPARTMENT_OTHER): Payer: Self-pay | Admitting: Emergency Medicine

## 2022-09-27 DIAGNOSIS — M79602 Pain in left arm: Secondary | ICD-10-CM | POA: Diagnosis not present

## 2022-09-27 DIAGNOSIS — M25512 Pain in left shoulder: Secondary | ICD-10-CM | POA: Diagnosis not present

## 2022-09-27 LAB — COMPREHENSIVE METABOLIC PANEL
ALT: 20 U/L (ref 0–44)
AST: 16 U/L (ref 15–41)
Albumin: 3.6 g/dL (ref 3.5–5.0)
Alkaline Phosphatase: 73 U/L (ref 38–126)
Anion gap: 7 (ref 5–15)
BUN: 16 mg/dL (ref 6–20)
CO2: 28 mmol/L (ref 22–32)
Calcium: 8.3 mg/dL — ABNORMAL LOW (ref 8.9–10.3)
Chloride: 103 mmol/L (ref 98–111)
Creatinine, Ser: 0.69 mg/dL (ref 0.44–1.00)
GFR, Estimated: >60 mL/min (ref >60)
Glucose, Bld: 100 mg/dL — ABNORMAL HIGH (ref 70–99)
Potassium: 3.3 mmol/L — ABNORMAL LOW (ref 3.5–5.1)
Sodium: 138 mmol/L (ref 135–145)
Total Bilirubin: 1 mg/dL (ref 0.3–1.2)
Total Protein: 7.3 g/dL (ref 6.5–8.1)

## 2022-09-27 LAB — CBC WITH DIFFERENTIAL/PLATELET
Abs Immature Granulocytes: 0.03 10*3/uL (ref 0.00–0.07)
Basophils Absolute: 0 10*3/uL (ref 0.0–0.1)
Basophils Relative: 0 %
Eosinophils Absolute: 0.2 10*3/uL (ref 0.0–0.5)
Eosinophils Relative: 2 %
HCT: 42.3 % (ref 36.0–46.0)
Hemoglobin: 12.9 g/dL (ref 12.0–15.0)
Immature Granulocytes: 0 %
Lymphocytes Relative: 19 %
Lymphs Abs: 1.6 10*3/uL (ref 0.7–4.0)
MCH: 24.5 pg — ABNORMAL LOW (ref 26.0–34.0)
MCHC: 30.5 g/dL (ref 30.0–36.0)
MCV: 80.4 fL (ref 80.0–100.0)
Monocytes Absolute: 0.5 10*3/uL (ref 0.1–1.0)
Monocytes Relative: 6 %
Neutro Abs: 6.3 10*3/uL (ref 1.7–7.7)
Neutrophils Relative %: 73 %
Platelets: 256 10*3/uL (ref 150–400)
RBC: 5.26 MIL/uL — ABNORMAL HIGH (ref 3.87–5.11)
RDW: 16 % — ABNORMAL HIGH (ref 11.5–15.5)
WBC: 8.7 10*3/uL (ref 4.0–10.5)
nRBC: 0 % (ref 0.0–0.2)

## 2022-09-27 LAB — TROPONIN I (HIGH SENSITIVITY): Troponin I (High Sensitivity): 4 ng/L (ref <18)

## 2022-09-27 LAB — D-DIMER, QUANTITATIVE: D-Dimer, Quant: <0.27 ug/mL-FEU (ref 0.00–0.50)

## 2022-09-27 MED ORDER — POTASSIUM CHLORIDE 20 MEQ PO PACK: 40.0000 meq | PACK | Freq: Once | ORAL | Status: DC

## 2022-09-27 MED ORDER — POTASSIUM CHLORIDE CRYS ER 20 MEQ PO TBCR
40.0000 meq | EXTENDED_RELEASE_TABLET | Freq: Once | ORAL | Status: AC
  Administered 2022-09-27: 40 meq via ORAL
  Filled 2022-09-27: qty 2

## 2022-09-27 MED ORDER — NAPROXEN 500 MG PO TABS: 500.0000 mg | ORAL_TABLET | Freq: Two times a day (BID) | ORAL | 0 refills | Status: AC | PRN

## 2022-09-27 MED ORDER — KETOROLAC TROMETHAMINE 15 MG/ML IJ SOLN
15.0000 mg | Freq: Once | INTRAMUSCULAR | Status: AC
  Administered 2022-09-27: 15 mg via INTRAVENOUS
  Filled 2022-09-27: qty 1

## 2022-09-27 NOTE — ED Provider Notes (Signed)
[facility] Provider Note   CSN: [id number] Arrival date & time: 09/27/22  1048     History  Chief Complaint  Patient presents with   Arm Pain    Natasha Winters is a 52 y.o. female, AV, undetectable level, who presents to the ED secondary to left arm pain for the past week.  She states she was seen in the ER 2 weeks ago, for left shoulder pain, she states she took the medications and her pain went away, but then she started having some left upper arm pain.  She states that it feels achy, and tender all the time.  Has taken anti-inflammatories without any relief.  Denies any nausea, vomiting, chest pain, shortness of breath.  No trauma to the area.  Denies any swelling, redness to the area.  Notes the pain is severe in nature.  Pain is worse when bending arm.    Home Medications Prior to Admission medications   Medication Sig Start Date End Date Taking? Authorizing Provider  naproxen (NAPROSYN) 500 MG tablet Take 1 tablet (500 mg total) by mouth 2 (two) times daily as needed. 09/27/22  Yes Deola Rewis L, PA  benzonatate (TESSALON) 100 MG capsule Take 1 capsule (100 mg total) by mouth every 8 (eight) hours. 05/24/21   Kommor, Madison, MD  conjugated estrogens (PREMARIN) vaginal cream Place vaginally. 03/17/20   [provider]  diazepam (VALIUM) 5 MG tablet Take 1 tablet (5 mg total) by mouth every 8 (eight) hours as needed for anxiety. 09/16/22 09/16/23  Elson Areas, PA-C  diclofenac (VOLTAREN) 75 MG EC tablet Take 1 tablet (75 mg total) by mouth 2 (two) times daily. 09/16/22   Elson Areas, PA-C  dicyclomine (BENTYL) 20 MG tablet Take 1 tablet (20 mg total) by mouth 2 (two) times daily. 03/20/22   Marita Kansas, PA-C  DULoxetine (CYMBALTA) 30 MG capsule Take 1 tablet by mouth daily. 04/10/20   [provider]  elvitegravir-cobicistat-emtricitabine-tenofovir (GENVOYA) 150-150-200-10 MG TABS tablet Take by mouth. 01/31/17   [provider]  ibuprofen (ADVIL) 800 MG tablet Take by mouth. 07/20/16   [provider]  metFORMIN (GLUCOPHAGE) 500 MG tablet Take by mouth. 03/17/20   [provider]  methocarbamol (ROBAXIN) 500 MG tablet Take 1 tablet (500 mg total) by mouth 2 (two) times daily. 09/14/21   Jeannie Fend, PA-C  metroNIDAZOLE (FLAGYL) 500 MG tablet Take 1 tablet (500 mg total) by mouth 2 (two) times daily. 03/19/22   Karie Mainland, Amjad, PA-C  ondansetron (ZOFRAN-ODT) 4 MG disintegrating tablet Take 1 tablet (4 mg total) by mouth every 8 (eight) hours as needed. 03/20/22   Marita Kansas, PA-C  phenazopyridine (PYRIDIUM) 200 MG tablet Take 1 tablet (200 mg total) by mouth 3 (three) times daily. 09/14/21   Jeannie Fend, PA-C      Allergies    Patient has no known allergies.    Review of Systems   Review of Systems  Cardiovascular:  Negative for chest pain.  Musculoskeletal:        +L arm pain    Physical Exam Updated Vital Signs BP 132/83   Pulse 67   Temp 97.7 F (36.5 C) (Oral)   Resp 18   LMP 11/22/2013 Comment: not preg, not sexually active  SpO2 100%  Physical Exam Vitals and nursing note reviewed.  Constitutional:      General: She is not in acute distress.    Appearance: She  is well-developed.  HENT:     Head: Normocephalic and atraumatic.  Eyes:     Conjunctiva/sclera: Conjunctivae normal.  Cardiovascular:     Rate and Rhythm: Normal rate and regular rhythm.     Heart sounds: No murmur heard. Pulmonary:     Effort: Pulmonary effort is normal. No respiratory distress.     Breath sounds: Normal breath sounds.  Abdominal:     Palpations: Abdomen is soft.     Tenderness: There is no abdominal tenderness.  Musculoskeletal:        General: No swelling.     Cervical back: Neck supple.     Comments: Left arm: Range of motion intact, flexion extension more painful of the elbow.  Supination pronation intact.  Tenderness to palpation of midshaft radius, distal radius.  No redness,  swelling, crepitus.  No tenderness to palpation of cervical spine, or shoulder.  Positive radial pulse.  Skin:    General: Skin is warm and dry.     Capillary Refill: Capillary refill takes less than 2 seconds.  Neurological:     Mental Status: She is alert.  Psychiatric:        Mood and Affect: Mood normal.     ED Results / Procedures / Treatments   Labs (all labs ordered are listed, but only abnormal results are displayed) Labs Reviewed  CBC WITH DIFFERENTIAL/PLATELET - Abnormal; Notable for the following components:      Result Value   RBC 5.26 (*)    MCH 24.5 (*)    RDW 16.0 (*)    All other components within normal limits  COMPREHENSIVE METABOLIC PANEL - Abnormal; Notable for the following components:   Potassium 3.3 (*)    Glucose, Bld 100 (*)    Calcium 8.3 (*)    All other components within normal limits  D-DIMER, QUANTITATIVE (NOT AT [facility])  TROPONIN I (HIGH SENSITIVITY)    EKG EKG Interpretation  Date/Time:  Friday September 27 2022 11:03:46 EDT Ventricular Rate:  63 PR Interval:  157 QRS Duration: 88 QT Interval:  431 QTC Calculation: 442 R Axis:   48 Text Interpretation: Sinus rhythm Consider right atrial enlargement No significant change since last tracing Confirmed by Linwood Dibbles [phone]) on 09/27/2022 11:07:59 AM  Radiology DG Chest 1 View  Result Date: 09/27/2022 CLINICAL DATA:  Left arm pain. EXAM: CHEST  1 VIEW COMPARISON:  Chest x-ray dated May 24, 2021. FINDINGS: Stable cardiomediastinal silhouette with mild cardiomegaly. Normal pulmonary vascularity. No focal consolidation, pleural effusion, or pneumothorax. No acute osseous abnormality. IMPRESSION: 1. No active disease. Electronically Signed   By: Obie Dredge M.D.   On: 09/27/2022 11:47   DG Humerus Left  Result Date: 09/27/2022 CLINICAL DATA:  Left arm pain for several weeks.  No injury. EXAM: LEFT HUMERUS - 2+ VIEW COMPARISON:  None Available. FINDINGS: There is no evidence of fracture or other  focal bone lesions. Soft tissues are unremarkable. IMPRESSION: Negative. Electronically Signed   By: Obie Dredge M.D.   On: 09/27/2022 11:46    Procedures Procedures    Medications Ordered in ED Medications  ketorolac (TORADOL) 15 MG/ML injection 15 mg (15 mg Intravenous Given 09/27/22 1123)    ED Course/ Medical Decision Making/ A&P                             Medical Decision Making Patient is a 52 year old female here for left arm pain has been going on  for the past week.  States it is worse with flexion extension of the arm.  Denies any repetitive activities.  Obtain x-ray, as well as rule out any kind of heart issues, with troponins, given that it is her left arm, and with her age.  There is no evidence of any kind of fluctuance, erythema, to suggest cellulitis.  Given that is worse with flexion extension I believe that it may be musculoskeletal in nature.  Amount and/or Complexity of Data Reviewed Labs: ordered.    Details: Unremarkable, mild hypokalemia 3.3, negative D-dimer negative troponins Radiology: ordered.    Details: Chest x-ray and arm x-ray unremarkable Discussion of management or test interpretation with external provider(s): Discussed with patient, findings are unremarkable, it appears that her pain is likely musculoskeletal given that is worse with flexion extension.  She denies history of repetitive activities.  We discussed follow-up with primary care doctor, and return precautions.  She voiced understanding.  She has a strong pulse, she has negative D-dimer, and did suggest low likelihood of clot, her troponins are within normal limits, and there is no evidence of any erythema, edema, warmth of the area.  Consider naproxen for pain control, and encouraged her to follow-up with the PCP.  Risk Prescription drug management.    Final Clinical Impression(s) / ED Diagnoses Final diagnoses:  Left arm pain    Rx / DC Orders ED Discharge Orders          Ordered     naproxen (NAPROSYN) 500 MG tablet  2 times daily PRN        09/27/22 1234              Emmet Messer, Harley [place], PA 09/27/22 1238    Linwood Dibbles, MD 09/30/22 [phone]

## 2022-09-27 NOTE — Discharge Instructions (Signed)
Today your workup was reassuring, and does not appear that you have a blood clot, in urine, or the issue is coming from your heart.  It is likely musculoskeletal.  Please follow-up with your primary care doctor, take the naproxen for pain medication as needed.  Return to the ER if you have any kind of increased shortness of breath, chest pain, loss of function of your hand, or pulse in your hand.

## 2022-09-27 NOTE — ED Triage Notes (Signed)
Left arm pain x several weeks states wa seen for neck pain before and given meds but they have not helped this apin

## 2023-02-17 IMAGING — DX DG CHEST 1V PORT
1 series · 1 of 1 positions shown · non-contrast
Comparison: 02/03/2021

CLINICAL DATA: Chest pain, cough

EXAM:
PORTABLE CHEST 1 VIEW

[chest ap]
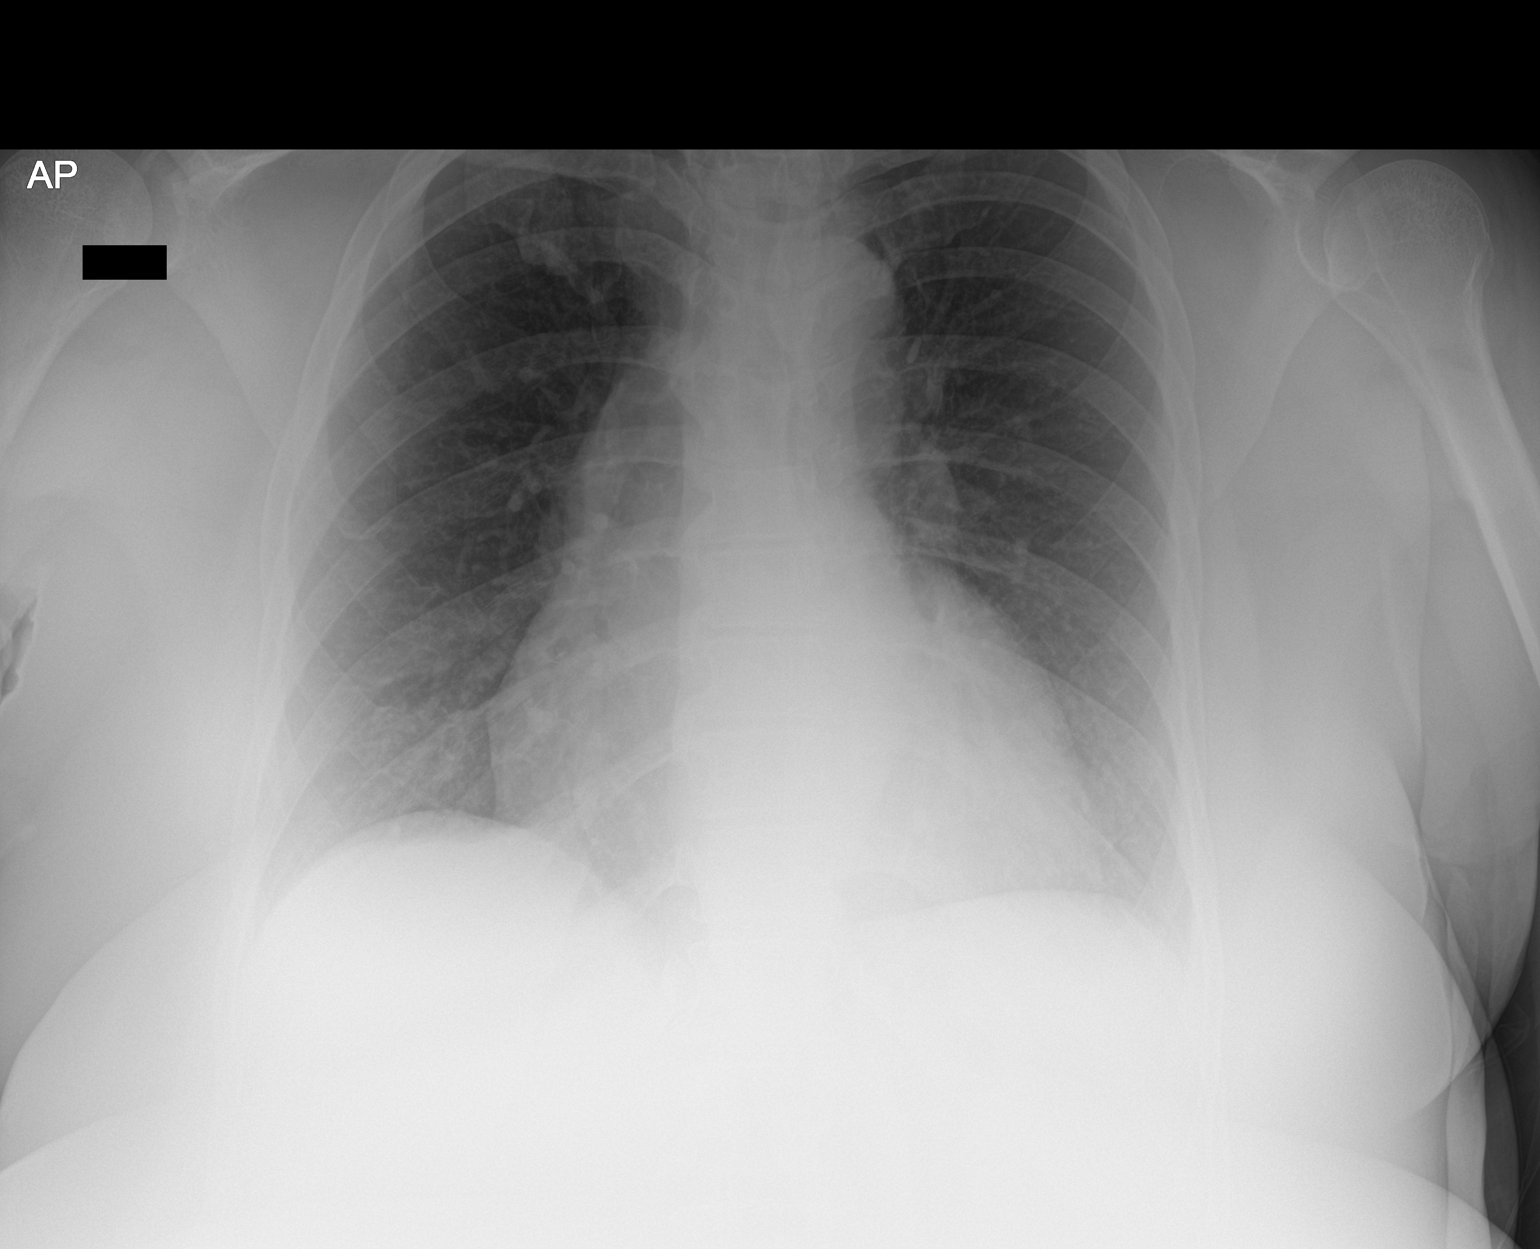

[1 of 1 positions shown; findings below may reference images not displayed]

FINDINGS: Transverse diameter of heart is increased. There are no signs of
pulmonary edema or new focal infiltrates. There is no pleural
effusion or pneumothorax.
IMPRESSION: Cardiomegaly. There are no signs of pulmonary edema or focal
pulmonary consolidation.

## 2024-05-06 ENCOUNTER — Emergency Department (HOSPITAL_BASED_OUTPATIENT_CLINIC_OR_DEPARTMENT_OTHER)
Admission: EM | Admit: 2024-05-06 | Discharge: 2024-05-06 | Disposition: A | Discharge status: Home / Self Care | Attending: Emergency Medicine | Admitting: Emergency Medicine

## 2024-05-06 ENCOUNTER — Other Ambulatory Visit: Payer: Self-pay

## 2024-05-06 ENCOUNTER — Encounter (HOSPITAL_BASED_OUTPATIENT_CLINIC_OR_DEPARTMENT_OTHER): Payer: Self-pay

## 2024-05-06 ENCOUNTER — Emergency Department (HOSPITAL_BASED_OUTPATIENT_CLINIC_OR_DEPARTMENT_OTHER)
Admission: EM | Admit: 2024-05-06 | Discharge: 2024-05-06 | Discharge status: Against Medical Advice | Attending: Emergency Medicine | Admitting: Emergency Medicine

## 2024-05-06 ENCOUNTER — Encounter (HOSPITAL_BASED_OUTPATIENT_CLINIC_OR_DEPARTMENT_OTHER): Payer: Self-pay | Admitting: Urology

## 2024-05-06 DIAGNOSIS — Z5321 Procedure and treatment not carried out due to patient leaving prior to being seen by health care provider: Secondary | ICD-10-CM | POA: Diagnosis not present

## 2024-05-06 DIAGNOSIS — Z79899 Other long term (current) drug therapy: Secondary | ICD-10-CM | POA: Diagnosis not present

## 2024-05-06 DIAGNOSIS — Z7984 Long term (current) use of oral hypoglycemic drugs: Secondary | ICD-10-CM | POA: Insufficient documentation

## 2024-05-06 DIAGNOSIS — R102 Pelvic and perineal pain unspecified side: Secondary | ICD-10-CM | POA: Diagnosis present

## 2024-05-06 DIAGNOSIS — N760 Acute vaginitis: Secondary | ICD-10-CM | POA: Diagnosis not present

## 2024-05-06 DIAGNOSIS — B9689 Other specified bacterial agents as the cause of diseases classified elsewhere: Secondary | ICD-10-CM

## 2024-05-06 DIAGNOSIS — E119 Type 2 diabetes mellitus without complications: Secondary | ICD-10-CM | POA: Insufficient documentation

## 2024-05-06 DIAGNOSIS — Z21 Asymptomatic human immunodeficiency virus [HIV] infection status: Secondary | ICD-10-CM | POA: Insufficient documentation

## 2024-05-06 LAB — URINALYSIS, ROUTINE W REFLEX MICROSCOPIC
Bilirubin Urine: NEGATIVE
Glucose, UA: NEGATIVE mg/dL
Hgb urine dipstick: NEGATIVE
Ketones, ur: NEGATIVE mg/dL
Leukocytes,Ua: NEGATIVE
Nitrite: NEGATIVE
Protein, ur: NEGATIVE mg/dL
Specific Gravity, Urine: >=1.03 (ref 1.005–1.030)
pH: 5.5 (ref 5.0–8.0)

## 2024-05-06 LAB — WET PREP, GENITAL
Sperm: NONE SEEN
Trich, Wet Prep: NONE SEEN
WBC, Wet Prep HPF POC: <10 (ref <10)
Yeast Wet Prep HPF POC: NONE SEEN

## 2024-05-06 MED ORDER — METRONIDAZOLE 500 MG PO TABS: 500.0000 mg | ORAL_TABLET | Freq: Two times a day (BID) | ORAL | 0 refills | Status: DC

## 2024-05-06 MED ORDER — METRONIDAZOLE 500 MG PO TABS
500.0000 mg | ORAL_TABLET | Freq: Once | ORAL | Status: AC
  Administered 2024-05-06: 500 mg via ORAL
  Filled 2024-05-06: qty 1

## 2024-05-06 NOTE — ED Notes (Signed)
 Pt c/o vaginal pain and dysuria, no discharge

## 2024-05-06 NOTE — ED Triage Notes (Signed)
 Pt here earlier today, LWBS  Returns with continued vaginal pain and urinary compalints  Denies discharge

## 2024-05-06 NOTE — Discharge Instructions (Addendum)
 Please follow-up with the Department of Public Health if any of your other STD testing results positive today.  Please also follow-up with your OB/GYN and primary care provider.  Seek emergency care if experiencing any new or worsening symptoms.

## 2024-05-06 NOTE — ED Notes (Addendum)
 Armband barcode mismatch when scanning for medication administration. CSN did not match current encounter. Pt. Was seen in the ED earlier today. Per pt. She left for about an hour and returned. When pt. Returned and checked in research officer, trade union did not give pt. A new armband which resulted in barcode mismatch when scanning for medication administration and sunquest labeling for Labs.

## 2024-05-06 NOTE — ED Provider Notes (Signed)
 [place] [facility] Provider Note   CSN: [id number] Arrival date & time: 05/06/24  1708     Patient presents with: Vaginal Pain   Natasha Winters is a 53 y.o. female with PMHx DM, HIV who presents to ED concerned for generalized vaginal pain for a couple of days. Intermittent dysuria and urinary frequency. Denies vaginal bleeding or discharge. Denies hematuria or hematochezia. Denies abdominal/pelvic pain. Patient wondering if pain is related to the scented bathroom toilet that she recently started using.   Denies fever, nausea, vomiting, diarrhea.     Vaginal Pain       Prior to Admission medications   Medication Sig Start Date End Date Taking? Authorizing Provider  metroNIDAZOLE  (FLAGYL ) 500 MG tablet Take 1 tablet (500 mg total) by mouth 2 (two) times daily. 05/06/24  Yes Baya Lentz, Nidia F, PA-C  benzonatate  (TESSALON ) 100 MG capsule Take 1 capsule (100 mg total) by mouth every 8 (eight) hours. 05/24/21   Kommor, Madison, MD  conjugated estrogens (PREMARIN) vaginal cream Place vaginally. 03/17/20   [provider]  diclofenac  (VOLTAREN ) 75 MG EC tablet Take 1 tablet (75 mg total) by mouth 2 (two) times daily. 09/16/22   Flint Sonny POUR, PA-C  dicyclomine  (BENTYL ) 20 MG tablet Take 1 tablet (20 mg total) by mouth 2 (two) times daily. 03/20/22   Hildegard Loge, PA-C  DULoxetine (CYMBALTA) 30 MG capsule Take 1 tablet by mouth daily. 04/10/20   [provider]  elvitegravir-cobicistat-emtricitabine-tenofovir (GENVOYA) 150-150-200-10 MG TABS tablet Take by mouth. 01/31/17   [provider]  ibuprofen (ADVIL) 800 MG tablet Take by mouth. 07/20/16   [provider]  metFORMIN (GLUCOPHAGE) 500 MG tablet Take by mouth. 03/17/20   [provider]  methocarbamol  (ROBAXIN ) 500 MG tablet Take 1 tablet (500 mg total) by mouth 2 (two) times daily. 09/14/21   Beverley Leita LABOR, PA-C  naproxen  (NAPROSYN ) 500 MG tablet Take 1  tablet (500 mg total) by mouth 2 (two) times daily as needed. 09/27/22   Small, Brooke L, PA  ondansetron  (ZOFRAN -ODT) 4 MG disintegrating tablet Take 1 tablet (4 mg total) by mouth every 8 (eight) hours as needed. 03/20/22   Hildegard, Amjad, PA-C  phenazopyridine  (PYRIDIUM ) 200 MG tablet Take 1 tablet (200 mg total) by mouth 3 (three) times daily. 09/14/21   Beverley Leita LABOR, PA-C    Allergies: Patient has no known allergies.    Review of Systems  Genitourinary:  Positive for vaginal pain.    Updated Vital Signs BP 130/88   Pulse 62   Temp 98 F (36.7 C) (Oral)   Resp 16   Ht 5' 7 (1.702 m)   Wt 105 kg   LMP 11/22/2013 Comment: not preg, not sexually active  SpO2 100%   BMI 36.26 kg/m   Physical Exam Vitals and nursing note reviewed. Exam conducted with a chaperone present.  Constitutional:      General: She is not in acute distress.    Appearance: She is not ill-appearing or toxic-appearing.  HENT:     Head: Normocephalic and atraumatic.  Eyes:     General: No scleral icterus.       Right eye: No discharge.        Left eye: No discharge.     Conjunctiva/sclera: Conjunctivae normal.  Cardiovascular:     Rate and Rhythm: Normal rate.  Pulmonary:     Effort: Pulmonary effort is normal.  Abdominal:     General: Abdomen  is flat. Bowel sounds are normal. There is no distension.     Palpations: Abdomen is soft. There is no mass.     Tenderness: There is no abdominal tenderness.  Genitourinary:    Comments: RN Shelia present for pelvic exam: - External: without ulcers, lesions, or lacerations - Vaginal Vault: without laceration, pooled blood, clots, foreign body - Cervix: without discoloration, friability, or masses - Os: closed without blood or discharge  Bimanual exam without foreign bodies, masses, or cervical motion tenderness. Skin:    General: Skin is warm and dry.  Neurological:     General: No focal deficit present.     Mental Status: She is alert and oriented to  person, place, and time. Mental status is at baseline.  Psychiatric:        Mood and Affect: Mood normal.        Behavior: Behavior normal.     (all labs ordered are listed, but only abnormal results are displayed) Labs Reviewed  WET PREP, GENITAL - Abnormal; Notable for the following components:      Result Value   Clue Cells Wet Prep HPF POC PRESENT (*)    All other components within normal limits  URINALYSIS, ROUTINE W REFLEX MICROSCOPIC  HIV ANTIBODY (ROUTINE TESTING W REFLEX)  GC/CHLAMYDIA PROBE AMP ([place]) NOT AT [facility]    EKG: None  Radiology: No results found.   Procedures   Medications Ordered in the ED  metroNIDAZOLE  (FLAGYL ) tablet 500 mg (has no administration in time range)                                    Medical Decision Making Amount and/or Complexity of Data Reviewed Labs: ordered.  Risk Prescription drug management.   This patient presents to the ED for concern of vaginal pain, this involves an extensive number of treatment options, and is a complaint that carries with it a high risk of complications and morbidity.  The differential diagnosis includes STD, foreign body, pelvic floor prolapse, PID, tuboovarian abscess.   Co morbidities that complicate the patient evaluation  HIV, DM   Additional history obtained:  Dr. Minta PCP    Problem List / ED Course / Critical interventions / Medication management  Patient presents to ED concern for vaginal pain.  Also with intermittent dysuria and urinary frequency.  Endorses a sharp pain in and thinks that is related to her scented toilet paper wipes.  Physical exam reassuring.  Patient afebrile with stable vitals. I Ordered, and personally interpreted labs.  UA not concerning for infection.  Wet prep positive for clue cells.  Rest of STD panel pending. Shared all results with patient.  Answered all questions.  Patient stating that she was suspicious that this might be BV.  I recommended  patient stop using the scented toilet paper wipes.  Also recommended following up with PCP/OB/GYN.  Patient agreeable to plan.  Will prescribe patient Flagyl . I have reviewed the patients home medicines and have made adjustments as needed The patient has been appropriately medically screened and/or stabilized in the ED. I have low suspicion for any other emergent medical condition which would require further screening, evaluation or treatment in the ED or require inpatient management. At time of discharge the patient is hemodynamically stable and in no acute distress. I have discussed work-up results and diagnosis with patient and answered all questions. Patient is agreeable with discharge plan. We  discussed strict return precautions for returning to the emergency department and they verbalized understanding.     Social Determinants of Health:  none      Final diagnoses:  BV (bacterial vaginosis)    ED Discharge Orders          Ordered    metroNIDAZOLE  (FLAGYL ) 500 MG tablet  2 times daily        05/06/24 2155               Hoy Nidia FALCON, PA-C 05/06/24 2158    Patt Alm Macho, MD 05/07/24 1450

## 2024-05-06 NOTE — ED Triage Notes (Signed)
 Pt arrives that she is having some vaginal pain. States that it burns when she pees. States that she does have a slight fishy smell but no discharge. States that this started about 4 days ago. Is having sex with same partner. Recently changed her toilet paper.

## 2024-05-07 NOTE — Progress Notes (Signed)
 Patient arrived for Cabenuva. Therapy plan reviewed and released. Allergies reviewed. Correct patient verified by 3 patient identifiers. Followed manufacturer instructions for medication prep, administration, and post injection monitoring. Administered Cabenuva injections as documented. Patient monitored for at least 5 minutes after injections. Pt tolerated well. Next appointment scheduled. Released to lobby, ambulatory, for discharge without complaints or apparent distress.

## 2024-05-09 NOTE — Progress Notes (Signed)
 I have reviewed the notes, assessments, and/or procedures performed by Leotis Needle, RN, I concur with her documentation of Kionna Brier Mcmasters.

## 2024-05-10 LAB — GC/CHLAMYDIA PROBE AMP (~~LOC~~) NOT AT ARMC
Chlamydia: POSITIVE — AB
Comment: NEGATIVE
Comment: NORMAL
Neisseria Gonorrhea: NEGATIVE

## 2024-05-12 ENCOUNTER — Telehealth (HOSPITAL_COMMUNITY): Payer: Self-pay

## 2024-05-12 ENCOUNTER — Ambulatory Visit (HOSPITAL_COMMUNITY): Payer: Self-pay

## 2024-05-12 MED ORDER — AZITHROMYCIN 500 MG PO TABS: 1000.0000 mg | ORAL_TABLET | Freq: Once | ORAL | 0 refills | Status: AC

## 2024-05-12 NOTE — Telephone Encounter (Signed)
 This patient tested positive for Chlamydia   She has NKDA I have informed the patient of her results and confirmed her pharmacy is correct in her chart. Treatment has been sent per protocol.   Arlander Katheryn RAMAN, RN

## 2024-06-22 ENCOUNTER — Other Ambulatory Visit: Payer: Self-pay

## 2024-06-22 ENCOUNTER — Encounter (HOSPITAL_BASED_OUTPATIENT_CLINIC_OR_DEPARTMENT_OTHER): Payer: Self-pay

## 2024-06-22 ENCOUNTER — Emergency Department (HOSPITAL_BASED_OUTPATIENT_CLINIC_OR_DEPARTMENT_OTHER)
Admission: EM | Admit: 2024-06-22 | Discharge: 2024-06-22 | Disposition: A | Discharge status: Home / Self Care | Attending: Emergency Medicine | Admitting: Emergency Medicine

## 2024-06-22 DIAGNOSIS — J029 Acute pharyngitis, unspecified: Secondary | ICD-10-CM | POA: Diagnosis not present

## 2024-06-22 DIAGNOSIS — R6883 Chills (without fever): Secondary | ICD-10-CM | POA: Diagnosis not present

## 2024-06-22 DIAGNOSIS — J111 Influenza due to unidentified influenza virus with other respiratory manifestations: Secondary | ICD-10-CM

## 2024-06-22 DIAGNOSIS — R059 Cough, unspecified: Secondary | ICD-10-CM | POA: Insufficient documentation

## 2024-06-22 DIAGNOSIS — R519 Headache, unspecified: Secondary | ICD-10-CM | POA: Insufficient documentation

## 2024-06-22 DIAGNOSIS — R0981 Nasal congestion: Secondary | ICD-10-CM | POA: Insufficient documentation

## 2024-06-22 MED ORDER — OSELTAMIVIR PHOSPHATE 75 MG PO CAPS: 75.0000 mg | ORAL_CAPSULE | Freq: Two times a day (BID) | ORAL | 0 refills | Status: AC

## 2024-06-22 NOTE — ED Provider Notes (Signed)
 " [place] EMERGENCY DEPARTMENT AT [facility] Provider Note   CSN: [id number] Arrival date & time: 06/22/24  9191     Patient presents with: URI   Tomorrow Natasha Winters is a 54 y.o. female.    URI    54 year old female presenting to the emergency department with influenza-like illness symptoms.  Patient states that she has had a cough, sore throat, chills, headache with associated nasal congestion since yesterday.  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  Prior to Admission medications  Medication Sig Start Date End Date Taking? Authorizing Provider  oseltamivir  (TAMIFLU ) 75 MG capsule Take 1 capsule (75 mg total) by mouth every 12 (twelve) hours. 06/22/24  Yes Jerrol Agent, MD  benzonatate  (TESSALON ) 100 MG capsule Take 1 capsule (100 mg total) by mouth every 8 (eight) hours. 05/24/21   Kommor, Madison, MD  conjugated estrogens (PREMARIN) vaginal cream Place vaginally. 03/17/20   [provider]  diclofenac  (VOLTAREN ) 75 MG EC tablet Take 1 tablet (75 mg total) by mouth 2 (two) times daily. 09/16/22   Flint Sonny POUR, PA-C  dicyclomine  (BENTYL ) 20 MG tablet Take 1 tablet (20 mg total) by mouth 2 (two) times daily. 03/20/22   Hildegard Loge, PA-C  DULoxetine (CYMBALTA) 30 MG capsule Take 1 tablet by mouth daily. 04/10/20   [provider]  elvitegravir-cobicistat-emtricitabine-tenofovir (GENVOYA) 150-150-200-10 MG TABS tablet Take by mouth. 01/31/17   [provider]  ibuprofen (ADVIL) 800 MG tablet Take by mouth. 07/20/16   [provider]  metFORMIN (GLUCOPHAGE) 500 MG tablet Take by mouth. 03/17/20   [provider]  methocarbamol  (ROBAXIN ) 500 MG tablet Take 1 tablet (500 mg total) by mouth 2 (two) times daily. 09/14/21   Beverley Leita LABOR, PA-C  metroNIDAZOLE  (FLAGYL ) 500 MG tablet Take 1 tablet (500 mg total) by mouth 2 (two) times daily. 05/06/24   Hoy Nidia FALCON, PA-C  naproxen  (NAPROSYN ) 500 MG tablet Take 1  tablet (500 mg total) by mouth 2 (two) times daily as needed. 09/27/22   Small, Brooke L, PA  ondansetron  (ZOFRAN -ODT) 4 MG disintegrating tablet Take 1 tablet (4 mg total) by mouth every 8 (eight) hours as needed. 03/20/22   Hildegard, Amjad, PA-C  phenazopyridine  (PYRIDIUM ) 200 MG tablet Take 1 tablet (200 mg total) by mouth 3 (three) times daily. 09/14/21   Beverley Leita LABOR, PA-C    Allergies: Patient has no known allergies.    Review of Systems  All other systems reviewed and are negative.   Updated Vital Signs BP (!) 129/95 (BP Location: Left Wrist)   Pulse 71   Temp 98.2 F (36.8 C) (Oral)   Resp 18   Ht 5' 7 (1.702 m)   Wt 106.6 kg   LMP 11/22/2013 Comment: not preg, not sexually active  SpO2 99%   BMI 36.81 kg/m   Physical Exam Vitals and nursing note reviewed.  Constitutional:      General: She is not in acute distress. HENT:     Head: Normocephalic and atraumatic.     Mouth/Throat:     Pharynx: Posterior oropharyngeal erythema present. No oropharyngeal exudate.  Eyes:     Conjunctiva/sclera: Conjunctivae normal.     Pupils: Pupils are equal, round, and reactive to light.  Cardiovascular:     Rate and Rhythm: Normal rate and regular rhythm.  Pulmonary:     Effort: Pulmonary effort is normal. No respiratory distress.     Breath sounds: Normal breath sounds.  Abdominal:     General: There is no distension.     Tenderness: There is no guarding.  Musculoskeletal:        General: No deformity or signs of injury.     Cervical back: Neck supple.  Skin:    Findings: No lesion or rash.  Neurological:     General: No focal deficit present.     Mental Status: She is alert. Mental status is at baseline.     (all labs ordered are listed, but only abnormal results are displayed) Labs Reviewed - No data to display  EKG: None  Radiology: No results found.   Procedures   Medications Ordered in the ED - No data to display                                  Medical  Decision Making   54 year old female presenting to the emergency department with influenza-like illness symptoms.  Patient states that she has had a cough, sore throat, chills, headache with associated nasal congestion since yesterday.  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  On arrival, the patient was afebrile, not tachycardic or tachypneic, saturating well on room air, hemodynamically stable.  Patient presenting with infants like illness.  Mild oropharyngeal erythema noted on exam, lungs clear to auscultation bilaterally.  On day 2 of symptoms.  Explained to the patient that we are not currently testing for flu accept for patient's who have significant medical comorbidities are being admitted to the hospital given lack of supply of test kits.  Patient will be treated presumptively for influenza given prevalence of virus in the community.  Offer the patient Tamiflu  and after discussion of risks and benefits, patient would like to proceed with Tamiflu  treatment.  Advised continued supportive care with Tylenol , Motrin for muscle aches and fever, continued oral rehydration outpatient.  Work note provided.  Patient overall stable for discharge.     Final diagnoses:  Influenza-like illness    ED Discharge Orders          Ordered    oseltamivir  (TAMIFLU ) 75 MG capsule  Every 12 hours        06/22/24 0824               Jerrol Agent, MD 06/22/24 [phone]  "

## 2024-06-22 NOTE — ED Notes (Signed)
 Discharge instructions reviewed with patient. Patient verbalizes understanding, no further questions at this time. Medications/prescriptions and follow up information provided. No acute distress noted at time of departure.

## 2024-06-22 NOTE — Discharge Instructions (Addendum)
 Recommend Tylenol  and Motrin for muscle aches and fever, continued outpatient rehydration.  Take Tamiflu  as well for likely influenza-like illness.

## 2024-06-22 NOTE — ED Triage Notes (Signed)
 C/o cough, sore throat, chills, headache since yesterday.

## 2024-06-28 ENCOUNTER — Other Ambulatory Visit: Payer: Self-pay

## 2024-06-28 ENCOUNTER — Encounter (HOSPITAL_BASED_OUTPATIENT_CLINIC_OR_DEPARTMENT_OTHER): Payer: Self-pay

## 2024-06-28 ENCOUNTER — Emergency Department (HOSPITAL_BASED_OUTPATIENT_CLINIC_OR_DEPARTMENT_OTHER)

## 2024-06-28 ENCOUNTER — Emergency Department (HOSPITAL_BASED_OUTPATIENT_CLINIC_OR_DEPARTMENT_OTHER): Admission: EM | Admit: 2024-06-28 | Discharge: 2024-06-28 | Disposition: A | Discharge status: Home / Self Care

## 2024-06-28 DIAGNOSIS — Z21 Asymptomatic human immunodeficiency virus [HIV] infection status: Secondary | ICD-10-CM | POA: Diagnosis not present

## 2024-06-28 DIAGNOSIS — R059 Cough, unspecified: Secondary | ICD-10-CM | POA: Diagnosis present

## 2024-06-28 DIAGNOSIS — R051 Acute cough: Secondary | ICD-10-CM | POA: Diagnosis not present

## 2024-06-28 DIAGNOSIS — Z7984 Long term (current) use of oral hypoglycemic drugs: Secondary | ICD-10-CM | POA: Insufficient documentation

## 2024-06-28 DIAGNOSIS — E119 Type 2 diabetes mellitus without complications: Secondary | ICD-10-CM | POA: Diagnosis not present

## 2024-06-28 MED ORDER — PSEUDOEPH-BROMPHEN-DM 30-2-10 MG/5ML PO SYRP: 5.0000 mL | ORAL_SOLUTION | Freq: Four times a day (QID) | ORAL | 0 refills | Status: AC | PRN

## 2024-06-28 MED ORDER — BENZONATATE 100 MG PO CAPS: 100.0000 mg | ORAL_CAPSULE | Freq: Three times a day (TID) | ORAL | 0 refills | Status: AC

## 2024-06-28 NOTE — ED Triage Notes (Signed)
 Pt in with flu symptoms last week, has been taking some pills to clear it up, now has been coughing ever since she came in since last week. Has been taking Mucinex and Nyquil. Denies other symptoms.

## 2024-06-28 NOTE — ED Provider Notes (Signed)
 " [place] EMERGENCY DEPARTMENT AT [facility] Provider Note   CSN: [id number] Arrival date & time: 06/28/24  0756     Patient presents with: Cough   Natasha Winters is a 54 y.o. female.   71 old female with past medical history of HIV on Hart therapy and diabetes with undetectable viral load presenting to the emergency department today with cough.  The patient was seen here on the sixth for flulike symptoms.  These been going on for few days at that point.  She was started on Tamiflu .  Has been taking these as prescribed.  She was having more nasal congestion and some myalgias at that time.  She states that she has developed a cough over the past few days.  Reports minimal shortness of breath with this.  The cough is minimally productive.  She states that she has been having some chills at home.  Denies any fevers.  She came to the ER today for further evaluation regarding this.  She denies any chest pain.  She has been trying over-the-counter medications without relief.   Cough      Prior to Admission medications  Medication Sig Start Date End Date Taking? Authorizing Provider  brompheniramine-pseudoephedrine-DM 30-2-10 MG/5ML syrup Take 5 mLs by mouth 4 (four) times daily as needed. 06/28/24  Yes Ula Prentice SAUNDERS, MD  CABENUVA 600 & 900 MG/3ML injection Inject 6 mLs into the muscle every 8 (eight) weeks. 07/21/23  Yes [provider]  doxycycline  (VIBRA -TABS) 100 MG tablet Take 100 mg by mouth daily. 05/27/24  Yes [provider]  HYDROcodone-acetaminophen  (NORCO/VICODIN) 5-325 MG tablet Take 1 tablet by mouth 3 (three) times daily as needed. 05/31/24  Yes [provider]  MOUNJARO 7.5 MG/0.5ML Pen Inject 7.5 mg into the skin once a week. 06/07/24  Yes [provider]  benzonatate  (TESSALON ) 100 MG capsule Take 1 capsule (100 mg total) by mouth every 8 (eight) hours. 06/28/24   Ula Prentice SAUNDERS, MD  conjugated estrogens (PREMARIN) vaginal cream  Place vaginally. 03/17/20   [provider]  diclofenac  (VOLTAREN ) 75 MG EC tablet Take 1 tablet (75 mg total) by mouth 2 (two) times daily. 09/16/22   Sofia, Leslie K, PA-C  dicyclomine  (BENTYL ) 20 MG tablet Take 1 tablet (20 mg total) by mouth 2 (two) times daily. 03/20/22   Hildegard Loge, PA-C  DULoxetine (CYMBALTA) 30 MG capsule Take 1 tablet by mouth daily. 04/10/20   [provider]  elvitegravir-cobicistat-emtricitabine-tenofovir (GENVOYA) 150-150-200-10 MG TABS tablet Take by mouth. 01/31/17   [provider]  ibuprofen (ADVIL) 800 MG tablet Take by mouth. 07/20/16   [provider]  metFORMIN (GLUCOPHAGE) 500 MG tablet Take by mouth. 03/17/20   [provider]  methocarbamol  (ROBAXIN ) 500 MG tablet Take 1 tablet (500 mg total) by mouth 2 (two) times daily. 09/14/21   Beverley Leita LABOR, PA-C  naproxen  (NAPROSYN ) 500 MG tablet Take 1 tablet (500 mg total) by mouth 2 (two) times daily as needed. 09/27/22   Small, Brooke L, PA  ondansetron  (ZOFRAN -ODT) 4 MG disintegrating tablet Take 1 tablet (4 mg total) by mouth every 8 (eight) hours as needed. 03/20/22   Hildegard, Amjad, PA-C  oseltamivir  (TAMIFLU ) 75 MG capsule Take 1 capsule (75 mg total) by mouth every 12 (twelve) hours. 06/22/24   Jerrol Agent, MD  phenazopyridine  (PYRIDIUM ) 200 MG tablet Take 1 tablet (200 mg total) by mouth 3 (three) times daily. 09/14/21   Beverley Leita LABOR, PA-C  Allergies: Patient has no known allergies.    Review of Systems  Respiratory:  Positive for cough.     Updated Vital Signs BP 129/89 (BP Location: Right Arm)   Pulse 69   Temp 97.8 F (36.6 C) (Oral)   Resp 16   Ht 5' 7 (1.702 m)   Wt 106.6 kg   LMP 11/22/2013 Comment: not preg, not sexually active  SpO2 99%   BMI 36.81 kg/m   Physical Exam Vitals and nursing note reviewed.   Gen: NAD Eyes: PERRL, EOMI HEENT: no oropharyngeal swelling Neck: trachea midline Resp: clear to auscultation bilaterally Card: RRR, no  murmurs, rubs, or gallops Abd: nontender, nondistended Extremities: no calf tenderness, no edema Vascular: 2+ radial pulses bilaterally, 2+ DP pulses bilaterally Skin: no rashes Psyc: acting appropriately   (all labs ordered are listed, but only abnormal results are displayed) Labs Reviewed - No data to display  EKG: None  Radiology: DG Chest 2 View Result Date: 06/28/2024 EXAM: 2 VIEW(S) XRAY OF THE CHEST 06/28/2024 08:25:09 AM COMPARISON: 09/27/2022 CLINICAL HISTORY: Cough FINDINGS: LUNGS AND PLEURA: No focal pulmonary opacity. No pleural effusion. No pneumothorax. HEART AND MEDIASTINUM: No acute abnormality of the cardiac and mediastinal silhouettes. BONES AND SOFT TISSUES: No acute osseous abnormality. Degenerative changes of thoracic spine. IMPRESSION: 1. No acute cardiopulmonary process. Electronically signed by: Waddell Calk MD MD 06/28/2024 08:55 AM EST RP Workstation: [id number]     Procedures   Medications Ordered in the ED - No data to display                                  Medical Decision Making 54 year old female with past medical history of diabetes and HIV on Hart therapy presenting to the emergency department today with cough.  The patient is well-appearing with stable vital signs.  Is denying any chest pain or lightheadedness.  Suspicion for pulmonary embolism is low at this time.  Will further evaluate the patient here with a chest x-ray to evaluate for developing post influenza pneumonia although her exam here is reassuring.  If the patient's x-ray is unremarkable we will treat her symptomatically.  Suspicion for ACS is low at this time as well and she is really denying any chest pain or anginal symptoms.  The patient's x-ray does not show any concerning findings.  She will be treated symptomatically for likely postviral cough.  She is discharged with return precautions.  Amount and/or Complexity of Data Reviewed Radiology: ordered.  Risk Prescription drug  management.        Final diagnoses:  Acute cough    ED Discharge Orders          Ordered    brompheniramine-pseudoephedrine-DM 30-2-10 MG/5ML syrup  4 times daily PRN        06/28/24 0858    benzonatate  (TESSALON ) 100 MG capsule  Every 8 hours        06/28/24 0858               Ula Prentice SAUNDERS, MD 06/28/24 [phone]  "

## 2024-06-28 NOTE — Discharge Instructions (Signed)
 Your x-ray did not show any concerning findings or findings consistent with pneumonia.  Please try the prescription cough medication prescribed for cough.  Follow-up with your doctor and return to the ER for worsening symptoms.

## 2024-07-21 ENCOUNTER — Other Ambulatory Visit (HOSPITAL_BASED_OUTPATIENT_CLINIC_OR_DEPARTMENT_OTHER): Payer: Self-pay

## 2024-07-21 ENCOUNTER — Encounter (HOSPITAL_BASED_OUTPATIENT_CLINIC_OR_DEPARTMENT_OTHER): Payer: Self-pay

## 2024-07-21 ENCOUNTER — Other Ambulatory Visit: Payer: Self-pay

## 2024-07-21 ENCOUNTER — Emergency Department (HOSPITAL_BASED_OUTPATIENT_CLINIC_OR_DEPARTMENT_OTHER)
Admission: EM | Admit: 2024-07-21 | Discharge: 2024-07-21 | Disposition: A | Discharge status: Home / Self Care | Attending: Emergency Medicine | Admitting: Emergency Medicine

## 2024-07-21 DIAGNOSIS — Z21 Asymptomatic human immunodeficiency virus [HIV] infection status: Secondary | ICD-10-CM | POA: Insufficient documentation

## 2024-07-21 DIAGNOSIS — N898 Other specified noninflammatory disorders of vagina: Secondary | ICD-10-CM

## 2024-07-21 DIAGNOSIS — B9689 Other specified bacterial agents as the cause of diseases classified elsewhere: Secondary | ICD-10-CM | POA: Insufficient documentation

## 2024-07-21 DIAGNOSIS — N760 Acute vaginitis: Secondary | ICD-10-CM | POA: Insufficient documentation

## 2024-07-21 DIAGNOSIS — E119 Type 2 diabetes mellitus without complications: Secondary | ICD-10-CM | POA: Insufficient documentation

## 2024-07-21 DIAGNOSIS — Z7984 Long term (current) use of oral hypoglycemic drugs: Secondary | ICD-10-CM | POA: Insufficient documentation

## 2024-07-21 LAB — URINALYSIS, ROUTINE W REFLEX MICROSCOPIC
Bilirubin Urine: NEGATIVE
Glucose, UA: NEGATIVE mg/dL
Hgb urine dipstick: NEGATIVE
Ketones, ur: NEGATIVE mg/dL
Leukocytes,Ua: NEGATIVE
Nitrite: POSITIVE — AB
Protein, ur: NEGATIVE mg/dL
Specific Gravity, Urine: >=1.03 (ref 1.005–1.030)
pH: 5.5 (ref 5.0–8.0)

## 2024-07-21 LAB — WET PREP, GENITAL
Sperm: NONE SEEN
Trich, Wet Prep: NONE SEEN
WBC, Wet Prep HPF POC: <10
Yeast Wet Prep HPF POC: NONE SEEN

## 2024-07-21 LAB — URINALYSIS, MICROSCOPIC (REFLEX)

## 2024-07-21 MED ORDER — METRONIDAZOLE 500 MG PO TABS
500.0000 mg | ORAL_TABLET | Freq: Two times a day (BID) | ORAL | 0 refills | Status: AC
  Filled 2024-07-21: qty 14, 7d supply, fill #0

## 2024-07-21 NOTE — Discharge Instructions (Signed)
 Your workup today showed bacterial vaginosis.  Antibiotic sent into the pharmacy for you.  The other results will come in a couple days.  If they are positive you will get a call to get these treated.  Return for any worsening symptoms.

## 2024-07-21 NOTE — ED Triage Notes (Signed)
 Pt reports increase in urination. States she had chlamydia last November and was treated. Advised to be rechecked in 30 days,but was not. Symptoms did not completely resolve. Pt complaining of vaginal irritation, odor,discharge and frequency

## 2024-07-21 NOTE — ED Provider Notes (Signed)
 " [place] EMERGENCY DEPART[facility] AT [facility] Provider Note   CSN: [id number] Arrival date & time: 07/21/24  1020     Patient presents with: Urinary Frequency   Natasha Winters is a 54 y.o. female.   54 year old female presents today for concern of couple month duration of urinary frequency, vaginal discharge.  She states in November she was diagnosed with chlamydia and was treated with 1 dose of a medication that she does not recall.  She was instructed to return in 30 days to have this test repeated but she did not return for the repeat test.  She states about 1 week prior to when she was supposed return her symptoms returned and they have persisted since then.  She denies any fever, abdominal pain.  The history is provided by the patient. No language interpreter was used.       Prior to Admission medications  Medication Sig Start Date End Date Taking? Authorizing Provider  benzonatate  (TESSALON ) 100 MG capsule Take 1 capsule (100 mg total) by mouth every 8 (eight) hours. 06/28/24   Ula Prentice SAUNDERS, MD  brompheniramine-pseudoephedrine-DM 30-2-10 MG/5ML syrup Take 5 mLs by mouth 4 (four) times daily as needed. 06/28/24   Ula Prentice SAUNDERS, MD  CABENUVA 600 & 900 MG/3ML injection Inject 6 mLs into the muscle every 8 (eight) weeks. 07/21/23   [provider]  conjugated estrogens (PREMARIN) vaginal cream Place vaginally. 03/17/20   [provider]  diclofenac  (VOLTAREN ) 75 MG EC tablet Take 1 tablet (75 mg total) by mouth 2 (two) times daily. 09/16/22   Flint Sonny POUR, PA-C  dicyclomine  (BENTYL ) 20 MG tablet Take 1 tablet (20 mg total) by mouth 2 (two) times daily. 03/20/22   Hildegard, Fahed Morten, PA-C  doxycycline  (VIBRA -TABS) 100 MG tablet Take 100 mg by mouth daily. 05/27/24   [provider]  DULoxetine (CYMBALTA) 30 MG capsule Take 1 tablet by mouth daily. 04/10/20   [provider]  elvitegravir-cobicistat-emtricitabine-tenofovir (GENVOYA) 150-150-200-10 MG  TABS tablet Take by mouth. 01/31/17   [provider]  HYDROcodone-acetaminophen  (NORCO/VICODIN) 5-325 MG tablet Take 1 tablet by mouth 3 (three) times daily as needed. 05/31/24   [provider]  ibuprofen (ADVIL) 800 MG tablet Take by mouth. 07/20/16   [provider]  metFORMIN (GLUCOPHAGE) 500 MG tablet Take by mouth. 03/17/20   [provider]  methocarbamol  (ROBAXIN ) 500 MG tablet Take 1 tablet (500 mg total) by mouth 2 (two) times daily. 09/14/21   Beverley Leita LABOR, PA-C  MOUNJARO 7.5 MG/0.5ML Pen Inject 7.5 mg into the skin once a week. 06/07/24   [provider]  naproxen  (NAPROSYN ) 500 MG tablet Take 1 tablet (500 mg total) by mouth 2 (two) times daily as needed. 09/27/22   Small, Brooke L, PA  ondansetron  (ZOFRAN -ODT) 4 MG disintegrating tablet Take 1 tablet (4 mg total) by mouth every 8 (eight) hours as needed. 03/20/22   Hildegard Loge, PA-C  oseltamivir  (TAMIFLU ) 75 MG capsule Take 1 capsule (75 mg total) by mouth every 12 (twelve) hours. 06/22/24   Jerrol Agent, MD  phenazopyridine  (PYRIDIUM ) 200 MG tablet Take 1 tablet (200 mg total) by mouth 3 (three) times daily. 09/14/21   Beverley Leita LABOR, PA-C    Allergies: Patient has no known allergies.    Review of Systems  Constitutional:  Negative for chills and fever.  Genitourinary:  Positive for frequency. Negative for decreased urine volume, dyspareunia, dysuria, vaginal bleeding, vaginal discharge and vaginal pain.  All other systems reviewed and are negative.   Updated Vital Signs BP (!) 134/100   Pulse 78   Temp 98.5 F (36.9 C)   Resp 18   Wt 106.6 kg   LMP 11/22/2013 Comment: not preg, not sexually active  SpO2 99%   BMI 36.81 kg/m   Physical Exam Vitals and nursing note reviewed.  Constitutional:      General: She is not in acute distress.    Appearance: Normal appearance. She is not ill-appearing.  HENT:     Head: Normocephalic and atraumatic.     Nose: Nose normal.  Eyes:      Conjunctiva/sclera: Conjunctivae normal.  Cardiovascular:     Rate and Rhythm: Normal rate and regular rhythm.  Pulmonary:     Effort: Pulmonary effort is normal. No respiratory distress.  Genitourinary:    Comments: GU exam deferred Musculoskeletal:        General: No deformity. Normal range of motion.  Skin:    Findings: No rash.  Neurological:     Mental Status: She is alert.     (all labs ordered are listed, but only abnormal results are displayed) Labs Reviewed  WET PREP, GENITAL - Abnormal; Notable for the following components:      Result Value   Clue Cells Wet Prep HPF POC PRESENT (*)    All other components within normal limits  URINALYSIS, ROUTINE W REFLEX MICROSCOPIC  GC/CHLAMYDIA PROBE AMP ([place]) NOT AT [facility]    EKG: None  Radiology: No results found.   Procedures   Medications Ordered in the ED - No data to display  Clinical Course as of 07/21/24 1145  Wed Jul 21, 2024  1118 Wet prep with clue cells.  UA pending. [AA]    Clinical Course User Index [AA] Hildegard Loge, PA-C                                 Medical Decision Making Amount and/or Complexity of Data Reviewed Labs: ordered.  Risk Prescription drug management.   Medical Decision Making / ED Course   This patient presents to the ED for concern of urinary frequency, vaginal discharge, irritation, this involves an extensive number of treatment options, and is a complaint that carries with it a high risk of complications and morbidity.  The differential diagnosis includes STI, BV, yeast infection, UTI, PID  MDM: 54 year old female presents today for concern of increased frequency in urination, vaginal discharge, vaginal irritation.  Symptoms ongoing intermittently for the past couple months.  She would like to be retested for gonorrhea and chlamydia.  She has history of HIV.  Compliant with her medications.  Reports 1 active sexual partner.  States about 1 month ago she started using  protection.  She states that her partner told her that he was tested for STIs and was negative but she is unsure if this is accurate. Patient will self swab for STI panel.  Will obtain UA.  No abdominal pain, fever suggest PID.  Wet prep positive for BV.  UA without evidence of UTI.  Patient discharged in stable condition with prescription for Flagyl .  Return precautions discussed.  Lab Tests: -I ordered, reviewed, and interpreted labs.   The pertinent results include:   Labs Reviewed  WET PREP, GENITAL - Abnormal; Notable for the following components:      Result Value   Clue Cells Wet Prep HPF POC PRESENT (*)  All other components within normal limits  URINALYSIS, ROUTINE W REFLEX MICROSCOPIC  GC/CHLAMYDIA PROBE AMP ([place]) NOT AT [facility]      EKG  EKG Interpretation Date/Time:    Ventricular Rate:    PR Interval:    QRS Duration:    QT Interval:    QTC Calculation:   R Axis:      Text Interpretation:          Medicines ordered and prescription drug management: No orders of the defined types were placed in this encounter.   -I have reviewed the patients home medicines and have made adjustments as needed   Reevaluation: After the interventions noted above, I reevaluated the patient and found that they have :stayed the same  Co morbidities that complicate the patient evaluation  Past Medical History:  Diagnosis Date   Diabetes mellitus without complication (HCC)    HIV (human immunodeficiency virus infection) (HCC)       Dispostion: Discharged in stable condition.  Return precaution discussed.  Patient voices understanding and is with the plan.   Final diagnoses:  BV (bacterial vaginosis)  Vaginal discharge    ED Discharge Orders          Ordered    metroNIDAZOLE  (FLAGYL ) 500 MG tablet  2 times daily        07/21/24 1144               Hildegard Loge, PA-C 07/21/24 1145    Towana Ozell BROCKS, MD 07/21/24 1700  "

## 2024-07-22 LAB — GC/CHLAMYDIA PROBE AMP (~~LOC~~) NOT AT ARMC
Chlamydia: NEGATIVE
Comment: NEGATIVE
Comment: NORMAL
Neisseria Gonorrhea: NEGATIVE
